# Patient Record
Sex: Male | Born: 1995 | Race: White | Hispanic: No | Marital: Single | Smoking: Never smoker
Health system: Southern US, Community
[De-identification: ages and names within clinical notes are randomized; demographics above are authoritative.]

## PROBLEM LIST (undated history)

## (undated) DIAGNOSIS — R569 Unspecified convulsions: Secondary | ICD-10-CM

## (undated) DIAGNOSIS — R55 Syncope and collapse: Secondary | ICD-10-CM

## (undated) DIAGNOSIS — K219 Gastro-esophageal reflux disease without esophagitis: Secondary | ICD-10-CM

## (undated) DIAGNOSIS — R011 Cardiac murmur, unspecified: Secondary | ICD-10-CM

## (undated) DIAGNOSIS — R112 Nausea with vomiting, unspecified: Secondary | ICD-10-CM

## (undated) DIAGNOSIS — J45990 Exercise induced bronchospasm: Secondary | ICD-10-CM

## (undated) DIAGNOSIS — J0100 Acute maxillary sinusitis, unspecified: Secondary | ICD-10-CM

## (undated) DIAGNOSIS — J309 Allergic rhinitis, unspecified: Secondary | ICD-10-CM

## (undated) DIAGNOSIS — Z9889 Other specified postprocedural states: Secondary | ICD-10-CM

## (undated) DIAGNOSIS — J45909 Unspecified asthma, uncomplicated: Secondary | ICD-10-CM

## (undated) DIAGNOSIS — Z23 Encounter for immunization: Secondary | ICD-10-CM

## (undated) HISTORY — DX: Syncope and collapse: R55

## (undated) HISTORY — DX: Allergic rhinitis, unspecified: J30.9

## (undated) HISTORY — DX: Exercise induced bronchospasm: J45.990

## (undated) HISTORY — DX: Encounter for immunization: Z23

## (undated) HISTORY — DX: Acute maxillary sinusitis, unspecified: J01.00

---

## 2014-03-03 ENCOUNTER — Encounter: Payer: Self-pay | Admitting: Neurology

## 2014-03-06 ENCOUNTER — Encounter: Payer: Self-pay | Admitting: Neurology

## 2014-03-06 ENCOUNTER — Ambulatory Visit (INDEPENDENT_AMBULATORY_CARE_PROVIDER_SITE_OTHER): Payer: Medicaid Other | Admitting: Neurology

## 2014-03-06 VITALS — BP 118/64 | HR 56 | Ht 71.5 in | Wt 175.0 lb

## 2014-03-06 DIAGNOSIS — R569 Unspecified convulsions: Secondary | ICD-10-CM

## 2014-03-06 NOTE — Progress Notes (Signed)
[facility]    Provider:  Dr Hosie Poisson Referring Provider: Tanna Furry, PA-C Primary Care Physician:  Tanna Furry, PA-C  CC:  Seizure disorder  HPI:  Anthony Yu is a 18 y.o. male here as a referral from Dr. Herbert Moors for seizure disorder.   Started 3 to 4 years ago. First episode described as talking to his mom, had acute LOC, completely unresponsive during the episode, had twitching of his extremities during the episode. Lasted a few minutes. Was very confused and disoriented after the episode. No tongue biting, no loss of bowel/bladder. He reports he could hear his mom talking during the event. Had MRI, CT head and EEG. Patient reports it was all normal. Per mom he has had 4 "large episodes" since the initial episode. Mom notes during the most recent event (a few months ago), he was talking normally, standing up, acutely became completely rigid with twitching of both all 4 extremities. Eyes were open and glassy, they helped him to the couch. It took around 30 minutes to return to normal. He will also have daily episodes where he will be sleeping and have jerking of his extremities during his sleep. Mom states she can sometimes wake him during these episodes.   Depakote was started >1 year ago. No noticeable difference since starting this medication. They do note he starts to shake when he is due his next dose. Etiology of the events are unclear. No history of seizures as a child, no febrile seizures. Mild cognitive difficulties, continues to have speech difficulties. Currently a college student, will be starting at [employer] in the spring. Otherwise healthy.  Younger sister has "essential tremor and small seizures". She is autistic. Patients sister has cerebral palsy and has seizures.   Review of Systems: Out of a complete 14 system review, the patient complains of only the following symptoms, and all other reviewed systems are negative. + headache, sleepiness seizure, passing  out, tremor, eye pain  History   Social History  . Marital Status: Single    Spouse Name: N/A    Number of Children: 0  . Years of Education: 12   Occupational History  . Not on file.   Social History Main Topics  . Smoking status: Never Smoker   . Smokeless tobacco: Never Used  . Alcohol Use: No  . Drug Use: No  . Sexual Activity: Not on file   Other Topics Concern  . Not on file   Social History Narrative   Patient is single.    Patient is a Consulting civil engineer.    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    Patient lives with family.              Family History  Problem Relation Age of Onset  . Hypertension    . Diabetes    . Cancer    . Anemia Mother   . Asthma Sister   . Asthma Mother   . Arthritis Mother   . Hypercholesterolemia Mother   . Breast cancer Maternal Grandmother   . Stroke    . Heart disease Father   . Heart disease Brother   . Heart disease Sister   . Heart attack      Past Medical History  Diagnosis Date  . Syncope and collapse   . Acute maxillary sinusitis   . Exercise induced bronchospasm   . Allergic rhinitis, cause unspecified   . Need for prophylactic vaccination and inoculation against varicella     Past Surgical History  Procedure Laterality Date  . None      Current Outpatient Prescriptions  Medication Sig Dispense Refill  . ergocalciferol (VITAMIN D2) 50000 UNITS capsule Take 50,000 Units by mouth every other day.      . Ranitidine HCl (ZANTAC PO) Take by mouth daily.      . divalproex (DEPAKOTE ER) 500 MG 24 hr tablet Take 500 mg by mouth daily.      Marland Kitchen EPINEPHrine (EPIPEN 2-PAK) 0.3 mg/0.3 mL IJ SOAJ injection Inject 0.3 mg into the muscle as directed.      . montelukast (SINGULAIR) 10 MG tablet Take 10 mg by mouth at bedtime.       No current facility-administered medications for this visit.    Allergies as of 03/06/2014 - Review Complete 03/06/2014  Allergen Reaction Noted  . Shellfish-derived products Anaphylaxis 03/06/2014  .  Benadryl [diphenhydramine hcl]  03/03/2014  . Diphenhydramine Nausea And Vomiting 03/06/2014    Vitals: BP 118/64  Pulse 56  Ht 5' 11.5" (1.816 m)  Wt 175 lb (79.379 kg)  BMI 24.07 kg/m2 Last Weight:  Wt Readings from Last 1 Encounters:  03/06/14 175 lb (79.379 kg)   Last Height:   Ht Readings from Last 1 Encounters:  03/06/14 5' 11.5" (1.816 m)     Physical exam: Exam: Gen: NAD, conversant Eyes: anicteric sclerae, moist conjunctivae HENT: Atraumatic, oropharynx clear Neck: Trachea midline; supple,  Lungs: CTA, no wheezing, rales, rhonic                          CV: RRR, no MRG Abdomen: Soft, non-tender;  Extremities: No peripheral edema  Skin: Normal temperature, no rash,  Psych: Appropriate affect, pleasant  Neuro: MS: AA&Ox3, appropriately interactive, normal affect   Speech: fluent w/o paraphasic error  Memory: good recent and remote recall  CN: PERRL, EOMI no nystagmus, no ptosis, sensation intact to LT V1-V3 bilat, face symmetric, no weakness, hearing grossly intact, palate elevates symmetrically, shoulder shrug 5/5 bilat,  tongue protrudes midline, no fasiculations noted.  Motor: normal bulk and tone Strength: 5/5  In all extremities  Coord: rapid alternating and point-to-point (FNF, HTS) movements intact.  Reflexes: symmetrical, bilat downgoing toes  Sens: LT intact in all extremities  Gait: posture, stance, stride and arm-swing normal. Tandem gait intact. Able to walk on heels and toes. Romberg absent.   Assessment:  After physical and neurologic examination, review of laboratory studies, imaging, neurophysiology testing and pre-existing records, assessment will be reviewed on the problem list.  Plan:  Treatment plan and additional workup will be reviewed under Problem List.  1)Seizures  18y/o gentleman with an outside diagnosis of seizures presenting for initial evaluation. 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 Prior to making any medication adjustments will get prior studies and refer for an ambulatory EEG. Long term if he requires an AED may consider switching to an agent with less side effects such as keppra. Follow up once ambulatory EEG is completed.    Elspeth Cho, DO  [facility] [address] [place], [place] [address]  Phone [phone] Fax [phone]

## 2014-03-06 NOTE — Patient Instructions (Signed)
Overall you are doing fairly well but I do want to suggest a few things today:   Remember to drink plenty of fluid, eat healthy meals and do not skip any meals. Try to eat protein with a every meal and eat a healthy snack such as fruit or nuts in between meals. Try to keep a regular sleep-wake schedule and try to exercise daily, particularly in the form of walking, 20-30 minutes a day, if you can.   As far as your medications are concerned, I would like to suggest you continue on the Depakote for now. Long term we may want to consider switching you to an agent with less side effects.   As far as diagnostic testing:  1) I would like you to have a long term EEG. You will be called to schedule this  Follow up once the EEG is completed. Please call us with any interim questions, concerns, problems, updates or refill requests.   My clinical assistant and will answer any of your questions and relay your messages to me and also relay most of my messages to you.   Our phone number is [phone]. We also have an after hours call service for urgent matters and there is a physician on-call for urgent questions. For any emergencies you know to call 911 or go to the nearest emergency room

## 2014-03-22 ENCOUNTER — Other Ambulatory Visit: Payer: Medicaid Other | Admitting: Radiology

## 2014-10-03 ENCOUNTER — Telehealth: Payer: Self-pay | Admitting: Neurology

## 2014-10-03 NOTE — Telephone Encounter (Signed)
Mother called to inquire about the status of the Ambulatory EEG that was ordered 03/06/14. After speaking with Carollee HerterShannon I was advised to have the patient come back in and be evaluated since he had not been seen in 7 months and the test was never done. The Mother verbalized understanding and I scheduled them to come in on 10/11/14 with Dr. Lucia GaskinsAhern.

## 2014-10-11 ENCOUNTER — Ambulatory Visit (INDEPENDENT_AMBULATORY_CARE_PROVIDER_SITE_OTHER): Payer: Medicaid Other | Admitting: Neurology

## 2014-10-11 ENCOUNTER — Encounter: Payer: Self-pay | Admitting: Neurology

## 2014-10-11 VITALS — BP 121/72 | HR 72 | Temp 98.0°F | Ht 75.0 in | Wt 170.0 lb

## 2014-10-11 DIAGNOSIS — R569 Unspecified convulsions: Secondary | ICD-10-CM

## 2014-10-11 MED ORDER — LEVETIRACETAM 500 MG PO TABS: 500.0000 mg | ORAL_TABLET | Freq: Two times a day (BID) | ORAL | Status: DC

## 2014-10-11 NOTE — Progress Notes (Addendum)
[facility]    Provider:  Dr Lucia Gaskins Referring Provider: Tanna Furry, PA-C Primary Care Physician:  Tanna Furry, PA-C  CC:  seizure  HPI:  Anthony Yu is a 19 y.o. male here as a follow up for seizure. He is a former patient of Dr. Hosie Poisson and is transitioning to my care. Seizures started 3-4 years ago, in the daytime he passed out, started jerking, wouldn't respond to 6-7 minutes(see Dr. Minus Breeding previous notes). Mother provides all the information. Last "big" seizure was 4 months ago. They were watching light shows with flashing, he started "jolting" eyes opens, no tongue biting or urination, head going "all over" in every direction, usually happens at night and he ends up on the floor. He stopped Depakote medication months ago. He has no idea how often he has them now, he could be having them every day, twitches on the couch if he falls asleep. In the last month maybe 2-3 times. He has been evaluated by multiple neurologists and sound like he has had multiple extended normal EEGs. Mother states doctors they have seen didn't think he had seizures.   Dr. Hosie Poisson 03/06/2014: Started 3 to 4 years ago. First episode described as talking to his mom, had acute LOC, completely unresponsive during the episode, had twitching of his extremities during the episode. Lasted a few minutes. Was very confused and disoriented after the episode. No tongue biting, no loss of bowel/bladder. He reports he could hear his mom talking during the event. Had MRI, CT head and EEG. Patient reports it was all normal. Per mom he has had 4 "large episodes" since the initial episode. Mom notes during the most recent event (a few months ago), he was talking normally, standing up, acutely became completely rigid with twitching of both all 4 extremities. Eyes were open and glassy, they helped him to the couch. It took around 30 minutes to return to normal. He will also have daily episodes where he will be sleeping  and have jerking of his extremities during his sleep. Mom states she can sometimes wake him during these episodes.   Depakote was started >1 year ago. No noticeable difference since starting this medication. They do note he starts to shake when he is due his next dose. Etiology of the events are unclear. No history of seizures as a child, no febrile seizures. Mild cognitive difficulties, continues to have speech difficulties. Currently a college student, will be starting at [employer] in the spring. Otherwise healthy.  Younger sister has "essential tremor and small seizures". She is autistic. Patients sister has cerebral palsy and has seizures.    Review of Systems: Patient complains of symptoms per HPI as well as the following symptoms: food allergies, hearing loss, runny nose, restless leg, insomnia, headache, seizure, tremors, pasing out, snoring, frequent awakening. Pertinent negatives per HPI. All others negative.   History   Social History  . Marital Status: Single    Spouse Name: N/A  . Number of Children: 0  . Years of Education: 12   Occupational History  . Unemployed    Social History Main Topics  . Smoking status: Never Smoker   . Smokeless tobacco: Never Used  . Alcohol Use: No  . Drug Use: No  . Sexual Activity: Not on file   Other Topics Concern  . Not on file   Social History Narrative   Patient is single.    Patient is a Consulting civil engineer.    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  Patient lives with family.    Caffeine: Drinks coffee rarely   Drinks 2-3 cups tea per day   Drinks soda rarely                    Family History  Problem Relation Age of Onset  . Hypertension    . Diabetes    . Cancer    . Anemia Mother   . Asthma Sister   . Asthma Mother   . Arthritis Mother   . Hypercholesterolemia Mother   . Breast cancer Maternal Grandmother   . Stroke    . Heart disease Father   . Heart disease Brother   . Heart disease Sister   . Heart attack      Past Medical  History  Diagnosis Date  . Syncope and collapse   . Acute maxillary sinusitis   . Exercise induced bronchospasm   . Allergic rhinitis, cause unspecified   . Need for prophylactic vaccination and inoculation against varicella     Past Surgical History  Procedure Laterality Date  . None      Current Outpatient Prescriptions  Medication Sig Dispense Refill  . EPINEPHrine (EPIPEN 2-PAK) 0.3 mg/0.3 mL IJ SOAJ injection Inject 0.3 mg into the muscle as directed.    . ergocalciferol (VITAMIN D2) 50000 UNITS capsule Take 50,000 Units by mouth every other day.    . montelukast (SINGULAIR) 10 MG tablet Take 10 mg by mouth at bedtime.    . Ranitidine HCl (ZANTAC PO) Take 150 mg by mouth daily.     Marland Kitchen. albuterol (PROVENTIL) (2.5 MG/3ML) 0.083% nebulizer solution Inhale 2.5 mg into the lungs.    . divalproex (DEPAKOTE ER) 500 MG 24 hr tablet Take 500 mg by mouth daily.     No current facility-administered medications for this visit.    Allergies as of 10/11/2014 - Review Complete 10/11/2014  Allergen Reaction Noted  . Bee venom Anaphylaxis 10/11/2014  . Shellfish-derived products Anaphylaxis 03/06/2014  . Benadryl [diphenhydramine hcl]  03/03/2014  . Diphenhydramine Nausea And Vomiting 03/06/2014    Vitals: BP 121/72 mmHg  Pulse 72  Temp(Src) 98 F (36.7 C)  Ht 6\' 3"  (1.905 m)  Wt 170 lb (77.111 kg)  BMI 21.25 kg/m2 Last Weight:  Wt Readings from Last 1 Encounters:  10/11/14 170 lb (77.111 kg)   Last Height:   Ht Readings from Last 1 Encounters:  10/11/14 6\' 3"  (1.905 m)   Physical exam: Exam: Gen: NAD, conversant, well nourised, obese, well groomed                     CV: RRR, no MRG. No Carotid Bruits. No peripheral edema, warm, nontender Eyes: Conjunctivae clear without exudates or hemorrhage  Neuro: Detailed Neurologic Exam  Speech:    Speech is normal; fluent and spontaneous with normal comprehension.  Cognition:    The patient is oriented to person, place, and  time;     recent and remote memory intact;     language fluent;     normal attention, concentration,     fund of knowledge Cranial Nerves:    The pupils are equal, round, and reactive to light. The fundi are normal and spontaneous venous pulsations are present. Visual fields are full to finger confrontation. Extraocular movements are intact. Trigeminal sensation is intact and the muscles of mastication are normal. The face is symmetric. The palate elevates in the midline. Hearing intact. Voice is normal. Shoulder shrug is normal. The  tongue has normal motion without fasciculations.   Coordination:    Normal finger to nose and heel to shin. Normal rapid alternating movements.   Gait:    Heel-toe and tandem gait are normal.   Motor Observation:    No asymmetry, no atrophy, and no involuntary movements noted. Tone:    Normal muscle tone.    Posture:    Posture is normal. normal erect    Strength:    Strength is V/V in the upper and lower limbs.     Assessment/Plan:  18y/o gentleman with an outside diagnosis of seizures presenting for follow up. 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 Need records.   [facility] in [place] need records  Had 2 day EEG at [facility] Dr. Luellen Pucker  Will start Keppra  twice dily No driving until 6 months seizure free, no bathing or swimming alone or anything that could hurt patient or others should he have a seizure  Received records from [facility]. Patient presents to the EMU with his mother for further evaluation and characterization of episodes of jerking started around the age of 34 and have not responded to antiepileptic drugs. EMU admission December 2015. The events occurred out of sleep and he tended to jerk and fall out of bed. Under a lot of stress.  Last seizure 2 weeks prior to admission. Patient also with episodes of stiffness and crying. No history of head trauma, febrile convulsions or CNS infections. 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 Patient was admitted to the EMU for further characterization of episodes of jerking in the night, study December 2015. He came in without AEDs as he felt that these were not helpful. The jerking started around the age of 4 and has not responded to antiepileptic drugs. He jerks and falls out of bed. Sporadic. In the setting of a lot of stress. Patient also reported episodes of stiffness and crying. Although his episodes had been nightly over the last few months, in the 2 weeks prior to admission he had not had any events. The patient had no events during this admission and felt he would not have any because he was too relaxed.   Notes from [facility] in [place]: Sleep study was completed in May 2015 and was essentially normal without any periodic limb movements. He had been on Depakote. The episodes increased despite increasing Depakote 1000 mg daily at bedtime. Routine EEG was negative. MRI of the brain dated April 2013 was normal, with and without contrast performed with seizure protocol.  Naomie Dean, MD  [facility] [address] [place], [place] [address]  Phone [phone] Fax [phone]  A total of 30 minutes was spent face-to-face with this patient. Over half this time was spent on counseling patient on the seizure diagnosis and different diagnostic and therapeutic options available.

## 2014-10-11 NOTE — Patient Instructions (Signed)
Overall you are doing fairly well but I do want to suggest a few things today:   Remember to drink plenty of fluid, eat healthy meals and do not skip any meals. Try to eat protein with a every meal and eat a healthy snack such as fruit or nuts in between meals. Try to keep a regular sleep-wake schedule and try to exercise daily, particularly in the form of walking, 20-30 minutes a day, if you can.   As far as your medications are concerned, I would like to suggest: Keppra 500mg  twice daily  I would like to see you back in 3 months, sooner if we need to. Please call us with any interim questions, concerns, problems, updates or refill requests.   Please also call us for any test results so we can go over those with you on the phone.  My clinical assistant and will answer any of your questions and relay your messages to me and also relay most of my messages to you.   Our phone number is [phone]. We also have an after hours call service for urgent matters and there is a physician on-call for urgent questions. For any emergencies you know to call 911 or go to the nearest emergency room

## 2014-10-12 ENCOUNTER — Telehealth: Payer: Self-pay | Admitting: *Deleted

## 2014-10-12 NOTE — Telephone Encounter (Signed)
[facility]

## 2014-10-12 NOTE — Telephone Encounter (Signed)
Request fax to Dr. [facility] and Dr. Luellen [facility]ucker[facility] office on 10/12/14 requesting records.

## 2014-10-13 ENCOUNTER — Telehealth: Payer: Self-pay | Admitting: *Deleted

## 2014-10-13 NOTE — Telephone Encounter (Signed)
Dr. [facility] office were unable to fax over records on patient, the records will be mail to our office.

## 2014-10-16 ENCOUNTER — Telehealth: Payer: Self-pay | Admitting: *Deleted

## 2014-10-16 NOTE — Telephone Encounter (Signed)
Receive records from Dr. [facility] on 10/16/14. The records on [place] desk.

## 2014-12-06 HISTORY — PX: NASAL SEPTUM SURGERY: SHX37

## 2015-01-10 ENCOUNTER — Ambulatory Visit (INDEPENDENT_AMBULATORY_CARE_PROVIDER_SITE_OTHER): Payer: Medicaid Other | Admitting: Neurology

## 2015-01-10 ENCOUNTER — Encounter: Payer: Self-pay | Admitting: Neurology

## 2015-01-10 VITALS — BP 129/74 | HR 73 | Ht 75.0 in | Wt 171.2 lb

## 2015-01-10 DIAGNOSIS — R569 Unspecified convulsions: Secondary | ICD-10-CM | POA: Diagnosis not present

## 2015-01-10 DIAGNOSIS — F445 Conversion disorder with seizures or convulsions: Secondary | ICD-10-CM | POA: Insufficient documentation

## 2015-01-10 MED ORDER — LEVETIRACETAM ER 500 MG PO TB24: 500.0000 mg | ORAL_TABLET | Freq: Every day | ORAL | Status: DC

## 2015-01-10 NOTE — Progress Notes (Signed)
[facility]    Provider:  Dr Lucia Gaskins Referring Provider: Tanna Furry, PA-C Primary Care Physician:  Tanna Furry, PA-C  CC:  Seizure  HPI:  Anthony Yu is a 19 y.o. male here as a referral from Dr. Herbert Moors for seizure-like events. 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 The other night, he felt his whole body shaking. He couldn't move or talk. He was in bed trying to go to sleep. Episodes always happen when sleeping. He is doing a sleep test at the end of this month in [place]. He has not had any episodes since being seen except for one stated above. Keppra may be helping but is making him stiff.   HPI: Anthony Yu is a 19 y.o. male here as a follow up for seizure. He is a former patient of Dr. Hosie Poisson and is transitioning to my care. Seizures started 3-4 years ago, in the daytime he passed out, started jerking, wouldn't respond to 6-7 minutes(see Dr. Minus Breeding previous notes). Mother provides all the information. Last "big" seizure was 4 months ago. They were watching light shows with flashing, he started "jolting" eyes opens, no tongue biting or urination, head going "all over" in every direction, usually happens at night and he ends up on the floor. He stopped Depakote medication months ago. He has no idea how often he has them now, he could be having them every day, twitches on the couch if he falls asleep. In the last month maybe 2-3 times. He has been evaluated by multiple neurologists and sound like he has had multiple extended normal EEGs. Mother states doctors they have seen didn't think he had seizures.   Dr. Hosie Poisson 03/06/2014: Started 3 to 4 years ago. First episode described as talking to his mom, had acute LOC, completely unresponsive during the episode, had twitching of his extremities during the episode. Lasted a few minutes. Was very confused and disoriented after the episode. No tongue biting, no loss of bowel/bladder. He reports he could hear his mom talking during the  event. Had MRI, CT head and EEG. Patient reports it was all normal. Per mom he has had 4 "large episodes" since the initial episode. Mom notes during the most recent event (a few months ago), he was talking normally, standing up, acutely became completely rigid with twitching of both all 4 extremities. Eyes were open and glassy, they helped him to the couch. It took around 30 minutes to return to normal. He will also have daily episodes where he will be sleeping and have jerking of his extremities during his sleep. Mom states she can sometimes wake him during these episodes.   Depakote was started >1 year ago. No noticeable difference since starting this medication. They do note he starts to shake when he is due his next dose. Etiology of the events are unclear. No history of seizures as a child, no febrile seizures. Mild cognitive difficulties, continues to have speech difficulties. Currently a college student, will be starting at [employer] in the spring. Otherwise healthy.  Younger sister has "essential tremor and small seizures". She is autistic. Patients sister has cerebral palsy and has seizures.   Review of Systems: Patient complains of symptoms per HPI as well as the following symptoms: Fatigue, eye discharge, eye itching, ringing in the ears, murmur, food allergies, restless legs, daytime sleepiness, snoring, joint pain, back pain, aching muscles, neck stiffness, dizziness, headache, seizure, tremors, agitation. Pertinent negatives per HPI. All others negative.   History   Social History  . Marital Status:  Single    Spouse Name: N/A  . Number of Children: 0  . Years of Education: 12   Occupational History  . Unemployed    Social History Main Topics  . Smoking status: Never Smoker   . Smokeless tobacco: Never Used  . Alcohol Use: No  . Drug Use: No  . Sexual Activity: Not on file   Other Topics Concern  . Not on file   Social History Narrative   Patient is a Consulting civil engineerstudent.    Patient  lives with family.    Caffeine: Drinks coffee rarely   Drinks 2-3 cups tea per day   Drinks soda rarely                    Family History  Problem Relation Age of Onset  . Hypertension    . Diabetes    . Cancer    . Anemia Mother   . Asthma Sister   . Asthma Mother   . Arthritis Mother   . Hypercholesterolemia Mother   . Breast cancer Maternal Grandmother   . Stroke    . Heart disease Father   . Heart disease Brother   . Heart disease Sister   . Heart attack      Past Medical History  Diagnosis Date  . Syncope and collapse   . Acute maxillary sinusitis   . Exercise induced bronchospasm   . Allergic rhinitis, cause unspecified   . Need for prophylactic vaccination and inoculation against varicella     Past Surgical History  Procedure Laterality Date  . Nasal septum surgery  June 2016    Deviated Septum    Current Outpatient Prescriptions  Medication Sig Dispense Refill  . albuterol (PROVENTIL HFA;VENTOLIN HFA) 108 (90 BASE) MCG/ACT inhaler Inhale 2 puffs into the lungs every 6 (six) hours as needed for wheezing or shortness of breath.    . EPINEPHrine (EPIPEN 2-PAK) 0.3 mg/0.3 mL IJ SOAJ injection Inject 0.3 mg into the muscle as directed.    . ergocalciferol (VITAMIN D2) 50000 UNITS capsule Take 50,000 Units by mouth every other day.    . levETIRAcetam (KEPPRA) 500 MG tablet Take 1 tablet (500 mg total) by mouth 2 (two) times daily. 60 tablet 6  . montelukast (SINGULAIR) 10 MG tablet Take 10 mg by mouth at bedtime.    Marland Kitchen. omeprazole (PRILOSEC) 20 MG capsule Take 20 mg by mouth daily.    . divalproex (DEPAKOTE ER) 500 MG 24 hr tablet Take 500 mg by mouth daily.     No current facility-administered medications for this visit.    Allergies as of 01/10/2015 - Review Complete 01/10/2015  Allergen Reaction Noted  . Bee venom Anaphylaxis 10/11/2014  . Shellfish-derived products Anaphylaxis 03/06/2014  . Benadryl [diphenhydramine hcl]  03/03/2014  . Diphenhydramine  Nausea And Vomiting 03/06/2014    Vitals: BP 129/74 mmHg  Pulse 73  Ht 6\' 3"  (1.905 m)  Wt 171 lb 3.2 oz (77.656 kg)  BMI 21.40 kg/m2 Last Weight:  Wt Readings from Last 1 Encounters:  01/10/15 171 lb 3.2 oz (77.656 kg)   Last Height:   Ht Readings from Last 1 Encounters:  01/10/15 6\' 3"  (1.905 m)   Physical exam: Exam: Gen: NAD, conversant, well nourised, well groomed                     CV: RRR, no MRG. No Carotid Bruits. No peripheral edema, warm, nontender Eyes: Conjunctivae clear without exudates or hemorrhage  Neuro: Detailed Neurologic Exam  Speech:    Speech is normal; fluent and spontaneous with normal comprehension.  Cognition:    The patient is oriented to person, place, and time;     recent and remote memory intact;     language fluent;     normal attention, concentration,     fund of knowledge Cranial Nerves:    The pupils are equal, round, and reactive to light. The fundi are normal and spontaneous venous pulsations are present. Visual fields are full to finger confrontation. Extraocular movements are intact. Trigeminal sensation is intact and the muscles of mastication are normal. The face is symmetric. The palate elevates in the midline. Hearing intact. Voice is normal. Shoulder shrug is normal. The tongue has normal motion without fasciculations.   Coordination:    Normal finger to nose and heel to shin. Normal rapid alternating movements.     Assessment/Plan:  18y/o gentleman with an outside diagnosis of seizures presenting for follow up. 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   Review of records: He was admitted to the [facility] and diagnosed with non-epileptic seizures. Sleep study was completed in May 2015 and was essentially normal without any periodic  limb movements. He had been on Depakote. He was diagnosed with complex partial seizure.Sleep study was completed in May 2015 and was essentially normal without any periodic limb movements. Routine EEG was negative. MRI of the brain dated April 2013 was normal, with and without contrast performed with seizure protocol.   No driving until 6 months seizure free, no bathing or swimming alone or anything that could hurt patient or others should he have a seizure  Addendum [facility]: Impression this patient's history suggests nonepileptic events. Patient was admitted to the EMU for further characterization of episodes of jerking in the night, study December 2015. He came in without AEDs as he felt that these were not helpful. The jerking started around the age of 89 and has not responded to antiepileptic drugs. He jerks and falls out of bed. Sporadic. In the setting of a lot of stress. Patient also reported episodes of stiffness and crying. Although his episodes had been nightly over the last few months, in the 2 weeks prior to admission he had not had any events. The patient had no events during this admission and felt he would not have any because he was too relaxed.    MRi of the brain w/wo contrast EEG Will order long-term monitoring if EEG negative Per mother, he had episodes with long-term monitoring. No seizures were seen on EEG. He does feel the Keppra is helping. Will continue on a low dose of XR in the evenings but if workup is negative will discontinue.      Naomie Dean, MD  [facility] [address] [place], [place] [address]  Phone [phone] Fax [phone]  A total of 30 minutes was spent in with this patient face to face. Over half this time was spent on counseling patient on the diagnosis and different therapeutic options available for evaluation of seizures vs non-epileptic events.

## 2015-01-10 NOTE — Patient Instructions (Signed)
Overall you are doing fairly well but I do want to suggest a few things today:   Remember to drink plenty of fluid, eat healthy meals and do not skip any meals. Try to eat protein with a every meal and eat a healthy snack such as fruit or nuts in between meals. Try to keep a regular sleep-wake schedule and try to exercise daily, particularly in the form of walking, 20-30 minutes a day, if you can.   As far as your medications are concerned, I would like to suggest: change to keppra 500mg  at night extended release  As far as diagnostic testing: MRi of the brain and eeg. If negative will order long-term eeg.  I would like to see you back in 4 months, sooner if we need to. Please call us with any interim questions, concerns, problems, updates or refill requests.   Please also call us for any test results so we can go over those with you on the phone.  My clinical assistant and will answer any of your questions and relay your messages to me and also relay most of my messages to you.   Our phone number is [phone]. We also have an after hours call service for urgent matters and there is a physician on-call for urgent questions. For any emergencies you know to call 911 or go to the nearest emergency room

## 2015-01-17 ENCOUNTER — Telehealth: Payer: Self-pay | Admitting: *Deleted

## 2015-01-17 ENCOUNTER — Ambulatory Visit (INDEPENDENT_AMBULATORY_CARE_PROVIDER_SITE_OTHER): Payer: Medicaid Other | Admitting: Neurology

## 2015-01-17 DIAGNOSIS — R569 Unspecified convulsions: Secondary | ICD-10-CM

## 2015-01-17 NOTE — Procedures (Signed)
    History:  Anthony Yu is a 19 year old patient with episodes of total body shaking, unable to move or talk. Episodes occur out of sleep. The patient is being evaluated for possible seizures.  This is a routine EEG. No skull defects are noted. Medications include albuterol, vitamin D supplementation, Keppra, Singulair, and Prilosec.   EEG classification: Normal awake and asleep  Description of the recording: The background rhythms of this recording consists of a fairly well modulated medium amplitude background activity of 9 Hz. As the record progresses, the patient initially is in the waking state, but appears to enter the early stage II sleep during the recording, with rudimentary sleep spindles and vertex sharp wave activity seen. During the wakeful state, photic stimulation is performed, and this results in a bilateral and symmetric photic driving response. Hyperventilation was also performed, and this results in a minimal buildup of the background rhythm activities without significant slowing seen. At no time during the recording does there appear to be evidence of spike or spike wave discharges or evidence of focal slowing. EKG monitor shows no evidence of cardiac rhythm abnormalities with a heart rate of 78.  Impression: This is a normal EEG recording in the waking and sleeping state. No evidence of ictal or interictal discharges were seen at any time during the recording.

## 2015-01-17 NOTE — Telephone Encounter (Signed)
Thanks Kara MeadEmma for reminding me! When you get back, would you be able to help me please place a referral for 72-hour home EEG monitoring for this patient please?

## 2015-01-17 NOTE — Telephone Encounter (Signed)
Left detailed VM to let pt know EEG was normal. Told him I will let Dr. Lucia GaskinsAhern know and she will possibly order long-term EEG since it was normal. Told him to call back with further questions. Gave [facility] phone # and office hours. Dr. Lucia GaskinsAhern and I are not in office on Friday's.

## 2015-01-24 ENCOUNTER — Other Ambulatory Visit: Payer: Self-pay | Admitting: *Deleted

## 2015-01-24 DIAGNOSIS — F445 Conversion disorder with seizures or convulsions: Secondary | ICD-10-CM

## 2015-01-24 NOTE — Telephone Encounter (Signed)
Spoke w/ mother to let her know [facility] 72-hour EEG in home study. Told her to call w/ further questions. Mother verbalized understanding.

## 2015-01-25 ENCOUNTER — Encounter (INDEPENDENT_AMBULATORY_CARE_PROVIDER_SITE_OTHER): Payer: Medicaid Other | Admitting: Neurology

## 2015-01-25 ENCOUNTER — Ambulatory Visit
Admission: RE | Admit: 2015-01-25 | Discharge: 2015-01-25 | Disposition: A | Discharge status: Home / Self Care | Payer: Medicaid Other | Source: Ambulatory Visit | Attending: Neurology | Admitting: Neurology

## 2015-01-25 ENCOUNTER — Other Ambulatory Visit: Payer: Self-pay | Admitting: Neurology

## 2015-01-25 DIAGNOSIS — R569 Unspecified convulsions: Secondary | ICD-10-CM

## 2015-01-31 ENCOUNTER — Telehealth: Payer: Self-pay | Admitting: *Deleted

## 2015-01-31 NOTE — Telephone Encounter (Signed)
Spoke w/ mother about unremarkable MRI brain. No further questions at this time. Told her to call back if they have further questions.

## 2015-04-04 ENCOUNTER — Telehealth: Payer: Self-pay | Admitting: Neurology

## 2015-04-04 NOTE — Telephone Encounter (Signed)
I called patient and his mother, let a message. Asked why they no-showed to [facility]. Asked them to call me back.

## 2015-04-04 NOTE — Telephone Encounter (Signed)
[facility] [phone] called to advise, Dr Lucia Gaskins referred patient there for 72hr ambulatory EEG. Patient was no show for appointment today.

## 2015-05-14 ENCOUNTER — Telehealth: Payer: Self-pay | Admitting: *Deleted

## 2015-05-14 ENCOUNTER — Ambulatory Visit: Payer: Medicaid Other | Admitting: Neurology

## 2015-05-14 NOTE — Telephone Encounter (Signed)
no showed follow up appointment  

## 2015-05-15 ENCOUNTER — Encounter: Payer: Self-pay | Admitting: Neurology

## 2019-10-07 ENCOUNTER — Encounter (HOSPITAL_COMMUNITY): Payer: Self-pay | Admitting: Emergency Medicine

## 2019-10-07 ENCOUNTER — Emergency Department (HOSPITAL_COMMUNITY): Payer: Self-pay

## 2019-10-07 ENCOUNTER — Emergency Department (HOSPITAL_COMMUNITY)
Admission: EM | Admit: 2019-10-07 | Discharge: 2019-10-07 | Disposition: A | Discharge status: Home / Self Care | Payer: Self-pay | Attending: Emergency Medicine | Admitting: Emergency Medicine

## 2019-10-07 ENCOUNTER — Other Ambulatory Visit: Payer: Self-pay

## 2019-10-07 DIAGNOSIS — R1033 Periumbilical pain: Secondary | ICD-10-CM

## 2019-10-07 DIAGNOSIS — J45909 Unspecified asthma, uncomplicated: Secondary | ICD-10-CM | POA: Insufficient documentation

## 2019-10-07 DIAGNOSIS — K429 Umbilical hernia without obstruction or gangrene: Secondary | ICD-10-CM | POA: Insufficient documentation

## 2019-10-07 DIAGNOSIS — K4091 Unilateral inguinal hernia, without obstruction or gangrene, recurrent: Secondary | ICD-10-CM | POA: Insufficient documentation

## 2019-10-07 DIAGNOSIS — Z79899 Other long term (current) drug therapy: Secondary | ICD-10-CM | POA: Insufficient documentation

## 2019-10-07 HISTORY — DX: Unspecified convulsions: R56.9

## 2019-10-07 HISTORY — DX: Unspecified asthma, uncomplicated: J45.909

## 2019-10-07 LAB — COMPREHENSIVE METABOLIC PANEL
ALT: 39 U/L (ref 0–44)
AST: 26 U/L (ref 15–41)
Albumin: 4.8 g/dL (ref 3.5–5.0)
Alkaline Phosphatase: 55 U/L (ref 38–126)
Anion gap: 11 (ref 5–15)
BUN: 11 mg/dL (ref 6–20)
CO2: 27 mmol/L (ref 22–32)
Calcium: 9.5 mg/dL (ref 8.9–10.3)
Chloride: 102 mmol/L (ref 98–111)
Creatinine, Ser: 1.23 mg/dL (ref 0.61–1.24)
GFR calc Af Amer: >60 mL/min (ref >60)
GFR calc non Af Amer: >60 mL/min (ref >60)
Glucose, Bld: 105 mg/dL — ABNORMAL HIGH (ref 70–99)
Potassium: 4.1 mmol/L (ref 3.5–5.1)
Sodium: 140 mmol/L (ref 135–145)
Total Bilirubin: 1.2 mg/dL (ref 0.3–1.2)
Total Protein: 7.6 g/dL (ref 6.5–8.1)

## 2019-10-07 LAB — CBC
HCT: 49.4 % (ref 39.0–52.0)
Hemoglobin: 15.9 g/dL (ref 13.0–17.0)
MCH: 26.7 pg (ref 26.0–34.0)
MCHC: 32.2 g/dL (ref 30.0–36.0)
MCV: 82.9 fL (ref 80.0–100.0)
Platelets: 276 10*3/uL (ref 150–400)
RBC: 5.96 MIL/uL — ABNORMAL HIGH (ref 4.22–5.81)
RDW: 12.2 % (ref 11.5–15.5)
WBC: 9.5 10*3/uL (ref 4.0–10.5)
nRBC: 0 % (ref 0.0–0.2)

## 2019-10-07 LAB — LIPASE, BLOOD: Lipase: 27 U/L (ref 11–51)

## 2019-10-07 MED ORDER — MORPHINE SULFATE (PF) 4 MG/ML IV SOLN
4.0000 mg | Freq: Once | INTRAVENOUS | Status: AC
  Administered 2019-10-07: 4 mg via INTRAVENOUS
  Filled 2019-10-07: qty 1

## 2019-10-07 MED ORDER — SODIUM CHLORIDE 0.9 % IV BOLUS
1000.0000 mL | Freq: Once | INTRAVENOUS | Status: AC
  Administered 2019-10-07: 15:00:00 1000 mL via INTRAVENOUS

## 2019-10-07 MED ORDER — IOHEXOL 300 MG/ML  SOLN
100.0000 mL | Freq: Once | INTRAMUSCULAR | Status: AC | PRN
  Administered 2019-10-07: 100 mL via INTRAVENOUS

## 2019-10-07 MED ORDER — ONDANSETRON HCL 4 MG/2ML IJ SOLN
4.0000 mg | Freq: Once | INTRAMUSCULAR | Status: AC
  Administered 2019-10-07: 15:00:00 4 mg via INTRAVENOUS
  Filled 2019-10-07: qty 2

## 2019-10-07 MED ORDER — SODIUM CHLORIDE 0.9% FLUSH: 3.0000 mL | Freq: Once | INTRAVENOUS | Status: DC

## 2019-10-07 NOTE — ED Triage Notes (Signed)
abd pain since this am states has a hernia umbilical  And  It started to hurt this am , no n/v but somne sweating  As pain increased , last BM this am  Denies dysuria

## 2019-10-07 NOTE — Discharge Instructions (Signed)
Have a hernia in your scrotum as well as your periumbilical region.  If you develop severe pain to either 1 of these areas you need to be seen here in the emergency department.  These areas do not look like they are incarcerated at this time which means I do not see decreased blood flow.  I would recommend following up with general surgery to have possible surgical intervention of these.

## 2019-10-07 NOTE — ED Provider Notes (Addendum)
[facility] EMERGENCY DEPARTMENT Provider Note   CSN: [id number] Arrival date & time: 10/07/19  1313     History Chief Complaint  Patient presents with   Abdominal Pain    Anthony Yu is a 24 y.o. male with past medical history significant for pseudoseizures, seizures, asthma, recurrent syncope who presents for evaluation abdominal pain.  Patient with longstanding umbilical hernia.  Woke up this morning with pain located to this area.  No emesis, passing flatulence and had a bowel movement this morning which is normal.  Denies fever, chills, nausea, vomiting, chest pain, shortness of breath, testicular pain, redness, swelling, dysuria, diarrhea or constipation.  Denies aggravating or alleviating factors.  Rates his pain a 5/10.  History obtained from patient and past medical record.  No interpreter is used.  HPI     Past Medical History:  Diagnosis Date   Acute maxillary sinusitis    Allergic rhinitis, cause unspecified    Asthma    Exercise induced bronchospasm    Need for prophylactic vaccination and inoculation against varicella    Seizures (HCC)    Syncope and collapse     Patient Active Problem List   Diagnosis Date Noted   Pseudoseizures 01/10/2015   Seizures (HCC) 03/06/2014    Past Surgical History:  Procedure Laterality Date   NASAL SEPTUM SURGERY  June 2016   Deviated Septum       Family History  Problem Relation Age of Onset   Anemia Mother    Asthma Mother    Arthritis Mother    Hypercholesterolemia Mother    Heart disease Father    Hypertension Other    Diabetes Other    Cancer Other    Asthma Sister    Breast cancer Maternal Grandmother    Stroke Other    Heart disease Brother    Heart disease Sister    Heart attack Other     Social History   Tobacco Use   Smoking status: Never Smoker   Smokeless tobacco: Never Used  Substance Use Topics   Alcohol use: No   Drug use: No    Home  Medications Prior to Admission medications   Medication Sig Start Date End Date Taking? Authorizing Provider  albuterol (PROVENTIL HFA;VENTOLIN HFA) 108 (90 BASE) MCG/ACT inhaler Inhale 2 puffs into the lungs every 6 (six) hours as needed for wheezing or shortness of breath.   Yes [provider]  EPINEPHrine (EPIPEN 2-PAK) 0.3 mg/0.3 mL IJ SOAJ injection Inject 0.3 mg into the muscle as directed.   Yes [provider]  omeprazole (PRILOSEC) 20 MG capsule Take 20 mg by mouth daily as needed (heartburn).    Yes [provider]    Allergies    Bee venom, Shellfish-derived products, Benadryl [diphenhydramine hcl], and Diphenhydramine  Review of Systems   Review of Systems  Constitutional: Negative.   HENT: Negative.   Respiratory: Negative.   Cardiovascular: Negative.   Gastrointestinal: Positive for abdominal pain. Negative for abdominal distention, anal bleeding, blood in stool, constipation, diarrhea, nausea, rectal pain and vomiting.  Genitourinary: Negative.   Musculoskeletal: Negative.   Skin: Negative.   Neurological: Negative.   All other systems reviewed and are negative.   Physical Exam Updated Vital Signs BP (!) 105/53 (BP Location: Left Arm)    Pulse 72    Temp (!) 97.5 F (36.4 C) (Oral)    Resp 17    Ht 6\' 3"  (1.905 m)    Wt 98.9 kg  SpO2 99%    BMI 27.25 kg/m   Physical Exam Vitals and nursing note reviewed.  Constitutional:      General: He is not in acute distress.    Appearance: He is well-developed. He is not ill-appearing, toxic-appearing or diaphoretic.  HENT:     Head: Normocephalic and atraumatic.     Mouth/Throat:     Mouth: Mucous membranes are moist.     Pharynx: Oropharynx is clear.  Eyes:     Pupils: Pupils are equal, round, and reactive to light.  Cardiovascular:     Rate and Rhythm: Normal rate and regular rhythm.     Heart sounds: Normal heart sounds.  Pulmonary:     Effort: Pulmonary effort is normal. No  respiratory distress.     Breath sounds: Normal breath sounds.  Abdominal:     General: Bowel sounds are normal. There is no distension.     Palpations: Abdomen is soft.     Tenderness: There is abdominal tenderness in the periumbilical area.     Hernia: A hernia is present. Hernia is present in the umbilical area.     Comments: Periumbilical hernia which is hardened.  No overlying skin changes.  Tender to palpation.  Was able to reduce this on initial exam.  Genitourinary:    Comments: Refused GU exam Musculoskeletal:        General: Normal range of motion.     Cervical back: Normal range of motion and neck supple.  Skin:    General: Skin is warm and dry.     Capillary Refill: Capillary refill takes less than 2 seconds.     Comments: No edema, erythema or warmth.  Neurological:     Mental Status: He is alert.     ED Results / Procedures / Treatments   Labs (all labs ordered are listed, but only abnormal results are displayed) Labs Reviewed  COMPREHENSIVE METABOLIC PANEL - Abnormal; Notable for the following components:      Result Value   Glucose, Bld 105 (*)    All other components within normal limits  CBC - Abnormal; Notable for the following components:   RBC 5.96 (*)    All other components within normal limits  LIPASE, BLOOD  URINALYSIS, ROUTINE W REFLEX MICROSCOPIC    EKG None  Radiology CT Abdomen Pelvis W Contrast  Result Date: 10/07/2019 CLINICAL DATA:  Abdominal pain, inguinal pain, suspected hernia EXAM: CT ABDOMEN AND PELVIS WITH CONTRAST TECHNIQUE: Multidetector CT imaging of the abdomen and pelvis was performed using the standard protocol following bolus administration of intravenous contrast. CONTRAST:  OMNIPAQUE IOHEXOL 300 MG/ML  SOLN COMPARISON:  None. FINDINGS: Lower chest: Hypoventilatory changes are seen the lung bases. No acute pleural or parenchymal lung disease. Hepatobiliary: No focal liver abnormality is seen. No gallstones, gallbladder wall  thickening, or biliary dilatation. Pancreas: Unremarkable. No pancreatic ductal dilatation or surrounding inflammatory changes. Spleen: Normal in size without focal abnormality. Adrenals/Urinary Tract: Adrenal glands are unremarkable. Kidneys are normal, without renal calculi, focal lesion, or hydronephrosis. Bladder is unremarkable. Stomach/Bowel: Normal appendix right lower quadrant. No bowel obstruction or ileus. No bowel wall thickening or inflammatory changes. Vascular/Lymphatic: No significant vascular findings are present. No enlarged abdominal or pelvic lymph nodes. Reproductive: Prostate is unremarkable. Other: There is a large right inguinal hernia, extending into the right hemiscrotum. Hernia sac measures 5.4 cm at the level of the inguinal canal. The hernia contains multiple loops of distal small bowel, though I do not see any  bowel wall thickening. Minimal fat stranding within the herniated fat. No free fluid. There is a fat containing supraumbilical hernia. Musculoskeletal: No acute or destructive bony lesions. Reconstructed images demonstrate no additional findings. IMPRESSION: 1. Large right inguinal hernia, extending into the right hemiscrotum, containing mesenteric fat and multiple loops of distal small bowel. No evidence of bowel wall edema or incarceration. 2. Fat containing umbilical hernia. Electronically Signed   By: Sharlet Salina M.D.   On: 10/07/2019 17:24    Procedures Hernia reduction  Date/Time: 10/07/2019 2:33 PM Performed by: Linwood Dibbles, PA-C Authorized by: Linwood Dibbles, PA-C  Consent: Verbal consent obtained. Written consent not obtained. Risks and benefits: risks, benefits and alternatives were discussed Consent given by: patient Patient understanding: patient states understanding of the procedure being performed Patient consent: the patient's understanding of the procedure matches consent given Procedure consent: procedure consent matches procedure  scheduled Relevant documents: relevant documents present and verified Test results: test results available and properly labeled Site marked: the operative site was marked Imaging studies: imaging studies available Required items: required blood products, implants, devices, and special equipment available Time out: Immediately prior to procedure a "time out" was called to verify the correct patient, procedure, equipment, support staff and site/side marked as required. Preparation: Patient was prepped and draped in the usual sterile fashion. Local anesthesia used: no  Anesthesia: Local anesthesia used: no  Sedation: Patient sedated: no  Patient tolerance: patient tolerated the procedure well with no immediate complications    (including critical care time)  Medications Ordered in ED Medications  sodium chloride flush (NS) 0.9 % injection 3 mL (3 mLs Intravenous Not Given 10/07/19 1703)  sodium chloride 0.9 % bolus 1,000 mL (0 mLs Intravenous Stopped 10/07/19 1600)  morphine 4 MG/ML injection 4 mg (4 mg Intravenous Given 10/07/19 1455)  ondansetron (ZOFRAN) injection 4 mg (4 mg Intravenous Given 10/07/19 1455)  iohexol (OMNIPAQUE) 300 MG/ML solution 100 mL (100 mLs Intravenous Contrast Given 10/07/19 1717)    ED Course  I have reviewed the triage vital signs and the nursing notes.  Pertinent labs & imaging results that were available during my care of the patient were reviewed by me and considered in my medical decision making (see chart for details).  24 year old male presents for evaluation of abdominal pain.  Longstanding history of periumbilical hernia.  He is afebrile, nonseptic, non-ill-appearing.  Heart and periumbilical hernia. No overlying skin changes.  I was able to reduce this on initial exam.  Labs were obtained from triage.  Will provide pain management  Labs and imaging personally reviewed and interpreted CBC without leukocytosis Metabolic panel with mild hyperglycemia to  105 CT AP with periumbilical hernia chaining fat.  Patient also has a large right inguinal hernia containing loops of bowel.  He is passing flatulence and having normal bowel movements.  Patient states he has had this previously.  He has no pain he is urinating without difficulty.  Evidence of incarceration or strangulation on CT scan.  Patient reassessed.  No return of periumbilical hernia.  Discussed CT findings of inguinal hernia.  He denies any testicular pain, swelling, redness, pain with bowel movements.  He is refusing GU exam. He is tolerating p.o. intake without difficulty.  We will have him follow-up with general surgery for his hernias.  No evidence of incarceration or strangulation on imaging at this time.  Patient is nontoxic, nonseptic appearing, in no apparent distress.  Patient's pain and other symptoms adequately managed in emergency department.  Fluid bolus given.  Labs, imaging and vitals reviewed.  Patient does not meet the SIRS or Sepsis criteria.  On repeat exam patient does not have a surgical abdomin and there are no peritoneal signs.  No indication of appendicitis, bowel obstruction, bowel perforation, cholecystitis, diverticulitis.  Patient discharged home with symptomatic treatment and given strict instructions for follow-up with their primary care physician.  I have also discussed reasons to return immediately to the ER.  Patient expresses understanding and agrees with plan.     MDM Rules/Calculators/A&P                       Final Clinical Impression(s) / ED Diagnoses Final diagnoses:  Periumbilical abdominal pain  Unilateral recurrent inguinal hernia without obstruction or gangrene  Umbilical hernia without obstruction and without gangrene    Rx / DC Orders ED Discharge Orders    None       Smith Mcnicholas A, PA-C 10/07/19 1802    Aiyah Scarpelli A, PA-C 10/07/19 1803    Charlesetta Shanks, MD 10/09/19 [phone]

## 2019-10-07 NOTE — ED Notes (Signed)
Taken to CT.

## 2019-12-19 ENCOUNTER — Ambulatory Visit: Payer: Self-pay | Admitting: General Surgery

## 2019-12-30 NOTE — Patient Instructions (Addendum)
DUE TO COVID-19 ONLY ONE VISITOR ARE ALLOWED TO COME WITH YOU AND STAY IN THE WAITING ROOM ONLY DURING PRE OP AND PROCEDURE. THEN TWO VISITORS MAY VISIT WITH YOU IN YOUR PRIVATE ROOM DURING VISITING HOURS ONLY!!   COVID SWAB TESTING MUST BE COMPLETED ON: Tuesday, January 10, 2020  At 8:30 AM [address], [place] [facility] enter pre surgical testing line (Must self quarantine after testing. Follow instructions on handout.)             Your procedure is scheduled on: Thursday, January 12, 2020   Report to [facility] Main  Entrance   Report to Short Stay at 5:30 AM   [facility])   Call this number if you have problems the morning of surgery [phone]   Do not eat food:After Midnight.   May have liquids until 4:30 AM day of surgery   CLEAR LIQUID DIET  Foods Allowed                                                                     Foods Excluded  Water, Black Coffee and tea, regular and decaf                             liquids that you cannot  Plain Jell-O in any flavor  (No red)                                           see through such as: Fruit ices (not with fruit pulp)                                     milk, soups, orange juice  Iced Popsicles (No red)                                    All solid food                                   Apple juices Sports drinks like Gatorade (No red) Lightly seasoned clear broth or consume(fat free) Sugar, honey syrup  Sample Menu Breakfast                                Lunch                                     Supper Cranberry juice                    Beef broth                            Chicken broth Jell-O  Grape juice                           Apple juice Coffee or tea                        Jell-O                                      Popsicle                                                Coffee or tea                        Coffee or tea   Oral Hygiene is  also important to reduce your risk of infection.                                    Remember - BRUSH YOUR TEETH THE MORNING OF SURGERY WITH YOUR REGULAR TOOTHPASTE   Do NOT smoke after Midnight   Take these medicines the morning of surgery with A SIP OF WATER: None   Bring Asthma Inhaler day of surgery                               You may not have any metal on your body including jewelry, and body piercings             Do not wear lotions, powders, perfumes/cologne, or deodorant                          Men may shave face and neck.   Do not bring valuables to the hospital. [place].   Contacts, dentures or bridgework may not be worn into surgery.    Patients discharged the day of surgery will not be allowed to drive home.   Special Instructions: Bring a copy of your healthcare power of attorney and living will documents         the day of surgery if you haven't scanned them in before.              Please read over the following fact sheets you were given: IF YOU HAVE QUESTIONS ABOUT YOUR PRE OP INSTRUCTIONS PLEASE CALL [phone]   [facility] - Preparing for Surgery Before surgery, you can play an important role.  Because skin is not sterile, your skin needs to be as free of germs as possible.  You can reduce the number of germs on your skin by washing with CHG (chlorahexidine gluconate) soap before surgery.  CHG is an antiseptic cleaner which kills germs and bonds with the skin to continue killing germs even after washing. Please DO NOT use if you have an allergy to CHG or antibacterial soaps.  If your skin becomes reddened/irritated stop using the CHG and inform your nurse when you arrive at Short Stay. Do not shave (including legs  and underarms) for at least 48 hours prior to the first CHG shower.  You may shave your face/neck.  Please follow these instructions carefully:  1.  Shower with CHG Soap the night before surgery and  the  morning of surgery.  2.  If you choose to wash your hair, wash your hair first as usual with your normal  shampoo.  3.  After you shampoo, rinse your hair and body thoroughly to remove the shampoo.                             4.  Use CHG as you would any other liquid soap.  You can apply chg directly to the skin and wash.  Gently with a scrungie or clean washcloth.  5.  Apply the CHG Soap to your body ONLY FROM THE NECK DOWN.   Do   not use on face/ open                           Wound or open sores. Avoid contact with eyes, ears mouth and   genitals (private parts).                       Wash face,  Genitals (private parts) with your normal soap.             6.  Wash thoroughly, paying special attention to the area where your    surgery  will be performed.  7.  Thoroughly rinse your body with warm water from the neck down.  8.  DO NOT shower/wash with your normal soap after using and rinsing off the CHG Soap.                9.  Pat yourself dry with a clean towel.            10.  Wear clean pajamas.            11.  Place clean sheets on your bed the night of your first shower and do not  sleep with pets. Day of Surgery : Do not apply any lotions/deodorants the morning of surgery.  Please wear clean clothes to the hospital/surgery center.  FAILURE TO FOLLOW THESE INSTRUCTIONS MAY RESULT IN THE CANCELLATION OF YOUR SURGERY  PATIENT SIGNATURE_________________________________  NURSE SIGNATURE__________________________________  ________________________________________________________________________

## 2019-12-30 NOTE — Progress Notes (Addendum)
COVID Vaccine Completed: N/A Date COVID Vaccine completed:N/A COVID vaccine manufacturer: N/A  PCP - N/A Cardiologist - N/A  Chest x-ray - N/A EKG - N/A Stress Test - N/A ECHO - N/A Cardiac Cath - N/A  Sleep Study - N/A CPAP - N/A  Fasting Blood Sugar - N/A Checks Blood Sugar __N/A___ times a day  Blood Thinner Instructions: N/A  Aspirin Instructions: N/A Last Dose: N/A  Anesthesia review: N/A  Patient denies shortness of breath, fever, cough and chest pain at PAT appointment   Patient verbalized understanding of instructions that were given to them at the PAT appointment. Patient was also instructed that they will need to review over the PAT instructions again at home before surgery.

## 2020-01-02 ENCOUNTER — Other Ambulatory Visit: Payer: Self-pay

## 2020-01-02 ENCOUNTER — Encounter (HOSPITAL_COMMUNITY): Payer: Self-pay

## 2020-01-02 ENCOUNTER — Encounter (HOSPITAL_COMMUNITY)
Admission: RE | Admit: 2020-01-02 | Discharge: 2020-01-02 | Disposition: A | Discharge status: Home / Self Care | Payer: Medicaid Other | Source: Ambulatory Visit | Attending: General Surgery | Admitting: General Surgery

## 2020-01-02 DIAGNOSIS — Z01812 Encounter for preprocedural laboratory examination: Secondary | ICD-10-CM | POA: Insufficient documentation

## 2020-01-02 HISTORY — DX: Cardiac murmur, unspecified: R01.1

## 2020-01-02 HISTORY — DX: Nausea with vomiting, unspecified: R11.2

## 2020-01-02 HISTORY — DX: Gastro-esophageal reflux disease without esophagitis: K21.9

## 2020-01-02 HISTORY — DX: Other specified postprocedural states: Z98.890

## 2020-01-02 LAB — CBC
HCT: 45.9 % (ref 39.0–52.0)
Hemoglobin: 14.4 g/dL (ref 13.0–17.0)
MCH: 26 pg (ref 26.0–34.0)
MCHC: 31.4 g/dL (ref 30.0–36.0)
MCV: 83 fL (ref 80.0–100.0)
Platelets: 244 10*3/uL (ref 150–400)
RBC: 5.53 MIL/uL (ref 4.22–5.81)
RDW: 12.4 % (ref 11.5–15.5)
WBC: 7.3 10*3/uL (ref 4.0–10.5)
nRBC: 0 % (ref 0.0–0.2)

## 2020-01-10 ENCOUNTER — Other Ambulatory Visit (HOSPITAL_COMMUNITY)
Admission: RE | Admit: 2020-01-10 | Discharge: 2020-01-10 | Disposition: A | Discharge status: Home / Self Care | Payer: Medicaid Other | Source: Ambulatory Visit | Attending: General Surgery | Admitting: General Surgery

## 2020-01-10 DIAGNOSIS — Z20822 Contact with and (suspected) exposure to covid-19: Secondary | ICD-10-CM | POA: Diagnosis not present

## 2020-01-10 DIAGNOSIS — Z01812 Encounter for preprocedural laboratory examination: Secondary | ICD-10-CM | POA: Diagnosis not present

## 2020-01-10 LAB — SARS CORONAVIRUS 2 (TAT 6-24 HRS): SARS Coronavirus 2: NEGATIVE

## 2020-01-12 ENCOUNTER — Other Ambulatory Visit: Payer: Self-pay

## 2020-01-12 ENCOUNTER — Ambulatory Visit (HOSPITAL_COMMUNITY): Payer: Self-pay | Admitting: Certified Registered Nurse Anesthetist

## 2020-01-12 ENCOUNTER — Ambulatory Visit (HOSPITAL_COMMUNITY)
Admission: RE | Admit: 2020-01-12 | Discharge: 2020-01-12 | Disposition: A | Discharge status: Home / Self Care | Payer: Self-pay | Source: Other Acute Inpatient Hospital | Attending: General Surgery | Admitting: General Surgery

## 2020-01-12 ENCOUNTER — Encounter (HOSPITAL_COMMUNITY)
Admission: RE | Disposition: A | Discharge status: Home / Self Care | Payer: Self-pay | Source: Other Acute Inpatient Hospital | Attending: General Surgery

## 2020-01-12 ENCOUNTER — Encounter (HOSPITAL_COMMUNITY): Payer: Self-pay | Admitting: General Surgery

## 2020-01-12 DIAGNOSIS — Z91030 Bee allergy status: Secondary | ICD-10-CM | POA: Insufficient documentation

## 2020-01-12 DIAGNOSIS — Z825 Family history of asthma and other chronic lower respiratory diseases: Secondary | ICD-10-CM | POA: Insufficient documentation

## 2020-01-12 DIAGNOSIS — Z809 Family history of malignant neoplasm, unspecified: Secondary | ICD-10-CM | POA: Insufficient documentation

## 2020-01-12 DIAGNOSIS — Z8261 Family history of arthritis: Secondary | ICD-10-CM | POA: Insufficient documentation

## 2020-01-12 DIAGNOSIS — Z91013 Allergy to seafood: Secondary | ICD-10-CM | POA: Insufficient documentation

## 2020-01-12 DIAGNOSIS — Z823 Family history of stroke: Secondary | ICD-10-CM | POA: Insufficient documentation

## 2020-01-12 DIAGNOSIS — R569 Unspecified convulsions: Secondary | ICD-10-CM | POA: Insufficient documentation

## 2020-01-12 DIAGNOSIS — K219 Gastro-esophageal reflux disease without esophagitis: Secondary | ICD-10-CM | POA: Insufficient documentation

## 2020-01-12 DIAGNOSIS — J45990 Exercise induced bronchospasm: Secondary | ICD-10-CM | POA: Insufficient documentation

## 2020-01-12 DIAGNOSIS — F17200 Nicotine dependence, unspecified, uncomplicated: Secondary | ICD-10-CM | POA: Insufficient documentation

## 2020-01-12 DIAGNOSIS — K4090 Unilateral inguinal hernia, without obstruction or gangrene, not specified as recurrent: Secondary | ICD-10-CM | POA: Insufficient documentation

## 2020-01-12 DIAGNOSIS — K429 Umbilical hernia without obstruction or gangrene: Secondary | ICD-10-CM | POA: Insufficient documentation

## 2020-01-12 DIAGNOSIS — Z91041 Radiographic dye allergy status: Secondary | ICD-10-CM | POA: Insufficient documentation

## 2020-01-12 DIAGNOSIS — Z833 Family history of diabetes mellitus: Secondary | ICD-10-CM | POA: Insufficient documentation

## 2020-01-12 DIAGNOSIS — R011 Cardiac murmur, unspecified: Secondary | ICD-10-CM | POA: Insufficient documentation

## 2020-01-12 DIAGNOSIS — Z8249 Family history of ischemic heart disease and other diseases of the circulatory system: Secondary | ICD-10-CM | POA: Insufficient documentation

## 2020-01-12 DIAGNOSIS — Z888 Allergy status to other drugs, medicaments and biological substances status: Secondary | ICD-10-CM | POA: Insufficient documentation

## 2020-01-12 HISTORY — PX: UMBILICAL HERNIA REPAIR: SHX196

## 2020-01-12 HISTORY — PX: INGUINAL HERNIA REPAIR: SHX194

## 2020-01-12 SURGERY — REPAIR, HERNIA, INGUINAL, ADULT
Anesthesia: General

## 2020-01-12 MED ORDER — MEPERIDINE HCL 50 MG/ML IJ SOLN: 6.2500 mg | INTRAMUSCULAR | Status: DC | PRN

## 2020-01-12 MED ORDER — ALBUTEROL SULFATE HFA 108 (90 BASE) MCG/ACT IN AERS
INHALATION_SPRAY | RESPIRATORY_TRACT | Status: AC
  Filled 2020-01-12: qty 6.7

## 2020-01-12 MED ORDER — FENTANYL CITRATE (PF) 100 MCG/2ML IJ SOLN
INTRAMUSCULAR | Status: AC
  Filled 2020-01-12: qty 2

## 2020-01-12 MED ORDER — CHLORHEXIDINE GLUCONATE CLOTH 2 % EX PADS: 6.0000 | MEDICATED_PAD | Freq: Once | CUTANEOUS | Status: DC

## 2020-01-12 MED ORDER — FENTANYL CITRATE (PF) 100 MCG/2ML IJ SOLN
INTRAMUSCULAR | Status: DC | PRN
  Administered 2020-01-12 (×7): 50 ug via INTRAVENOUS

## 2020-01-12 MED ORDER — BUPIVACAINE HCL 0.5 % IJ SOLN
INTRAMUSCULAR | Status: DC | PRN
  Administered 2020-01-12: 30 mL

## 2020-01-12 MED ORDER — BUPIVACAINE-EPINEPHRINE (PF) 0.25% -1:200000 IJ SOLN
INTRAMUSCULAR | Status: AC
  Filled 2020-01-12: qty 30

## 2020-01-12 MED ORDER — FENTANYL CITRATE (PF) 250 MCG/5ML IJ SOLN
INTRAMUSCULAR | Status: AC
  Filled 2020-01-12: qty 5

## 2020-01-12 MED ORDER — DEXAMETHASONE SODIUM PHOSPHATE 10 MG/ML IJ SOLN
INTRAMUSCULAR | Status: AC
  Filled 2020-01-12: qty 1

## 2020-01-12 MED ORDER — BUPIVACAINE HCL (PF) 0.5 % IJ SOLN
INTRAMUSCULAR | Status: AC
  Filled 2020-01-12: qty 30

## 2020-01-12 MED ORDER — ROCURONIUM BROMIDE 10 MG/ML (PF) SYRINGE
PREFILLED_SYRINGE | INTRAVENOUS | Status: DC | PRN
  Administered 2020-01-12: 100 mg via INTRAVENOUS

## 2020-01-12 MED ORDER — PROPOFOL 500 MG/50ML IV EMUL
INTRAVENOUS | Status: AC
  Filled 2020-01-12: qty 50

## 2020-01-12 MED ORDER — ONDANSETRON HCL 4 MG/2ML IJ SOLN
INTRAMUSCULAR | Status: AC
  Filled 2020-01-12: qty 2

## 2020-01-12 MED ORDER — IBUPROFEN 800 MG PO TABS
800.0000 mg | ORAL_TABLET | Freq: Three times a day (TID) | ORAL | 0 refills | Status: AC | PRN
Start: 2020-01-12 — End: ?

## 2020-01-12 MED ORDER — PROPOFOL 10 MG/ML IV BOLUS
INTRAVENOUS | Status: DC | PRN
  Administered 2020-01-12: 120 mg via INTRAVENOUS

## 2020-01-12 MED ORDER — CHLORHEXIDINE GLUCONATE 0.12 % MT SOLN
15.0000 mL | Freq: Once | OROMUCOSAL | Status: AC
  Administered 2020-01-12: 15 mL via OROMUCOSAL

## 2020-01-12 MED ORDER — LACTATED RINGERS IV SOLN: INTRAVENOUS | Status: DC

## 2020-01-12 MED ORDER — ROCURONIUM BROMIDE 10 MG/ML (PF) SYRINGE
PREFILLED_SYRINGE | INTRAVENOUS | Status: AC
  Filled 2020-01-12: qty 10

## 2020-01-12 MED ORDER — GABAPENTIN 300 MG PO CAPS
300.0000 mg | ORAL_CAPSULE | ORAL | Status: AC
  Administered 2020-01-12: 300 mg via ORAL
  Filled 2020-01-12: qty 1

## 2020-01-12 MED ORDER — DEXAMETHASONE SODIUM PHOSPHATE 4 MG/ML IJ SOLN
INTRAMUSCULAR | Status: DC | PRN
  Administered 2020-01-12: 10 mg via INTRAVENOUS

## 2020-01-12 MED ORDER — LIDOCAINE 2% (20 MG/ML) 5 ML SYRINGE
INTRAMUSCULAR | Status: AC
  Filled 2020-01-12: qty 5

## 2020-01-12 MED ORDER — BUPIVACAINE LIPOSOME 1.3 % IJ SUSP
20.0000 mL | Freq: Once | INTRAMUSCULAR | Status: AC
  Administered 2020-01-12: 20 mL
  Filled 2020-01-12: qty 20

## 2020-01-12 MED ORDER — CEFAZOLIN SODIUM-DEXTROSE 2-4 GM/100ML-% IV SOLN
2.0000 g | INTRAVENOUS | Status: AC
  Administered 2020-01-12: 2 g via INTRAVENOUS
  Filled 2020-01-12: qty 100

## 2020-01-12 MED ORDER — 0.9 % SODIUM CHLORIDE (POUR BTL) OPTIME
TOPICAL | Status: DC | PRN
  Administered 2020-01-12: 1000 mL

## 2020-01-12 MED ORDER — LIDOCAINE 2% (20 MG/ML) 5 ML SYRINGE
INTRAMUSCULAR | Status: DC | PRN
  Administered 2020-01-12: 100 mg via INTRAVENOUS

## 2020-01-12 MED ORDER — MIDAZOLAM HCL 2 MG/2ML IJ SOLN
INTRAMUSCULAR | Status: AC
  Filled 2020-01-12: qty 2

## 2020-01-12 MED ORDER — ACETAMINOPHEN 500 MG PO TABS
1000.0000 mg | ORAL_TABLET | ORAL | Status: AC
  Administered 2020-01-12: 1000 mg via ORAL
  Filled 2020-01-12: qty 2

## 2020-01-12 MED ORDER — ORAL CARE MOUTH RINSE: 15.0000 mL | Freq: Once | OROMUCOSAL | Status: AC

## 2020-01-12 MED ORDER — OXYCODONE HCL 5 MG PO TABS: 5.0000 mg | ORAL_TABLET | Freq: Four times a day (QID) | ORAL | 0 refills | Status: AC | PRN

## 2020-01-12 MED ORDER — HYDROMORPHONE HCL 1 MG/ML IJ SOLN: 0.2500 mg | INTRAMUSCULAR | Status: DC | PRN

## 2020-01-12 MED ORDER — ONDANSETRON HCL 4 MG/2ML IJ SOLN
INTRAMUSCULAR | Status: DC | PRN
  Administered 2020-01-12: 4 mg via INTRAVENOUS

## 2020-01-12 MED ORDER — ONDANSETRON HCL 4 MG/2ML IJ SOLN: 4.0000 mg | Freq: Once | INTRAMUSCULAR | Status: DC | PRN

## 2020-01-12 MED ORDER — KETOROLAC TROMETHAMINE 15 MG/ML IJ SOLN
15.0000 mg | INTRAMUSCULAR | Status: AC
  Administered 2020-01-12: 15 mg via INTRAVENOUS
  Filled 2020-01-12: qty 1

## 2020-01-12 MED ORDER — SUGAMMADEX SODIUM 200 MG/2ML IV SOLN
INTRAVENOUS | Status: DC | PRN
  Administered 2020-01-12: 200 mg via INTRAVENOUS

## 2020-01-12 MED ORDER — MIDAZOLAM HCL 5 MG/5ML IJ SOLN
INTRAMUSCULAR | Status: DC | PRN
  Administered 2020-01-12: 2 mg via INTRAVENOUS

## 2020-01-12 MED ORDER — ALBUTEROL SULFATE HFA 108 (90 BASE) MCG/ACT IN AERS
INHALATION_SPRAY | RESPIRATORY_TRACT | Status: DC | PRN
  Administered 2020-01-12: 5 via RESPIRATORY_TRACT

## 2020-01-12 SURGICAL SUPPLY — 51 items
BENZOIN TINCTURE PRP APPL 2/3 (GAUZE/BANDAGES/DRESSINGS) ×3 IMPLANT
BLADE SURG 15 STRL LF DISP TIS (BLADE) ×1 IMPLANT
BLADE SURG 15 STRL SS (BLADE) ×2
CELLS DAT CNTRL 66122 CELL SVR (MISCELLANEOUS) IMPLANT
CHLORAPREP W/TINT 26 (MISCELLANEOUS) ×3 IMPLANT
CLOSURE WOUND 1/2 X4 (GAUZE/BANDAGES/DRESSINGS)
COVER SURGICAL LIGHT HANDLE (MISCELLANEOUS) ×3 IMPLANT
COVER WAND RF STERILE (DRAPES) ×3 IMPLANT
DECANTER SPIKE VIAL GLASS SM (MISCELLANEOUS) ×3 IMPLANT
DERMABOND ADVANCED (GAUZE/BANDAGES/DRESSINGS) ×2
DERMABOND ADVANCED .7 DNX12 (GAUZE/BANDAGES/DRESSINGS) ×1 IMPLANT
DRAIN PENROSE 0.5X18 (DRAIN) IMPLANT
DRAPE LAPAROSCOPIC ABDOMINAL (DRAPES) ×3 IMPLANT
DRAPE LAPAROTOMY TRNSV 102X78 (DRAPES) ×3 IMPLANT
DRSG TEGADERM 4X4.75 (GAUZE/BANDAGES/DRESSINGS) IMPLANT
DRSG TELFA PLUS 4X6 ADH ISLAND (GAUZE/BANDAGES/DRESSINGS) ×3 IMPLANT
ELECT REM PT RETURN 15FT ADLT (MISCELLANEOUS) ×3 IMPLANT
GAUZE SPONGE 4X4 12PLY STRL (GAUZE/BANDAGES/DRESSINGS) IMPLANT
GLOVE BIOGEL PI IND STRL 7.0 (GLOVE) ×1 IMPLANT
GLOVE BIOGEL PI INDICATOR 7.0 (GLOVE) ×2
GLOVE SURG SS PI 7.0 STRL IVOR (GLOVE) ×3 IMPLANT
GOWN STRL REUS W/TWL LRG LVL3 (GOWN DISPOSABLE) ×3 IMPLANT
GOWN STRL REUS W/TWL XL LVL3 (GOWN DISPOSABLE) ×3 IMPLANT
KIT BASIN (CUSTOM PROCEDURE TRAY) ×3 IMPLANT
KIT TURNOVER KIT A (KITS) IMPLANT
MESH HERNIA 3X6 (Mesh General) ×3 IMPLANT
NEEDLE HYPO 22GX1.5 SAFETY (NEEDLE) ×3 IMPLANT
PACK BASIC VI WITH GOWN DISP (CUSTOM PROCEDURE TRAY) ×3 IMPLANT
PENCIL SMOKE EVACUATOR (MISCELLANEOUS) IMPLANT
RETRACTOR WND ALEXIS 25 LRG (MISCELLANEOUS) IMPLANT
RTRCTR WOUND ALEXIS 18CM MED (MISCELLANEOUS)
RTRCTR WOUND ALEXIS 25CM LRG (MISCELLANEOUS)
SPONGE LAP 18X18 RF (DISPOSABLE) IMPLANT
SPONGE LAP 4X18 RFD (DISPOSABLE) ×3 IMPLANT
STRIP CLOSURE SKIN 1/2X4 (GAUZE/BANDAGES/DRESSINGS) IMPLANT
SUT MNCRL AB 4-0 PS2 18 (SUTURE) ×3 IMPLANT
SUT NOVA NAB GS-21 0 18 T12 DT (SUTURE) IMPLANT
SUT PDS AB 0 CT 36 (SUTURE) ×6 IMPLANT
SUT PROLENE 2 0 CT2 30 (SUTURE) ×6 IMPLANT
SUT SILK 2 0 SH CR/8 (SUTURE) IMPLANT
SUT VIC AB 2-0 CT1 27 (SUTURE) ×2
SUT VIC AB 2-0 CT1 TAPERPNT 27 (SUTURE) ×1 IMPLANT
SUT VIC AB 2-0 SH 18 (SUTURE) ×3 IMPLANT
SUT VIC AB 3-0 SH 27 (SUTURE) ×6
SUT VIC AB 3-0 SH 27X BRD (SUTURE) ×1 IMPLANT
SUT VIC AB 3-0 SH 27XBRD (SUTURE) ×2 IMPLANT
SYR BULB IRRIG 60ML STRL (SYRINGE) ×3 IMPLANT
SYR CONTROL 10ML LL (SYRINGE) ×3 IMPLANT
TOWEL OR 17X26 10 PK STRL BLUE (TOWEL DISPOSABLE) ×3 IMPLANT
TOWEL OR NON WOVEN STRL DISP B (DISPOSABLE) ×3 IMPLANT
YANKAUER SUCT BULB TIP 10FT TU (MISCELLANEOUS) ×3 IMPLANT

## 2020-01-12 NOTE — Transfer of Care (Signed)
Immediate Anesthesia Transfer of Care Note  Patient: Anthony Yu  Procedure(s) Performed: RIGHT OPEN INGUINAL HERNIA REPAIR WITH MESH (N/A ) OPEN UMBILICAL HERNIA (N/A )  Patient Location: PACU  Anesthesia Type:General  Level of Consciousness: awake and patient cooperative  Airway & Oxygen Therapy: Patient Spontanous Breathing and Patient connected to face mask  Post-op Assessment: Report given to RN and Post -op Vital signs reviewed and stable  Post vital signs: Reviewed and stable  Last Vitals:  Vitals Value Taken Time  BP 124/81 01/12/20 1000  Temp    Pulse 78 01/12/20 1001  Resp 11 01/12/20 1001  SpO2 95 % 01/12/20 1001  Vitals shown include unvalidated device data.  Last Pain:  Vitals:   01/12/20 0556  TempSrc: Oral  PainSc:       Patients Stated Pain Goal: 4 (01/12/20 0547)  Complications: No complications documented.

## 2020-01-12 NOTE — Anesthesia Procedure Notes (Signed)
Procedure Name: Intubation Date/Time: 01/12/2020 7:45 AM Performed by: Vanessa Elrama, CRNA Pre-anesthesia Checklist: Patient identified, Emergency Drugs available, Suction available and Patient being monitored Patient Re-evaluated:Patient Re-evaluated prior to induction Oxygen Delivery Method: Circle system utilized Preoxygenation: Pre-oxygenation with 100% oxygen Induction Type: IV induction Ventilation: Mask ventilation without difficulty Laryngoscope Size: 2 and Miller Grade View: Grade I Tube type: Oral Tube size: 7.5 mm Number of attempts: 1 Airway Equipment and Method: Stylet Placement Confirmation: ETT inserted through vocal cords under direct vision,  positive ETCO2 and breath sounds checked- equal and bilateral Secured at: 21 cm Tube secured with: Tape Dental Injury: Teeth and Oropharynx as per pre-operative assessment

## 2020-01-12 NOTE — Anesthesia Preprocedure Evaluation (Signed)
Anesthesia Evaluation  Patient identified by MRN, date of birth, ID band Patient awake    Reviewed: Allergy & Precautions, NPO status , Patient's Chart, lab work & pertinent test results  History of Anesthesia Complications (+) PONV  Airway Mallampati: I  TM Distance: >3 FB Neck ROM: Full    Dental   Pulmonary    Pulmonary exam normal        Cardiovascular Normal cardiovascular exam     Neuro/Psych Seizures -,     GI/Hepatic GERD  Medicated and Controlled,  Endo/Other    Renal/GU      Musculoskeletal   Abdominal   Peds  Hematology   Anesthesia Other Findings   Reproductive/Obstetrics                             Anesthesia Physical Anesthesia Plan  ASA: II  Anesthesia Plan: General   Post-op Pain Management:    Induction: Intravenous  PONV Risk Score and Plan: 3 and Ondansetron, Dexamethasone and Midazolam  Airway Management Planned: Oral ETT  Additional Equipment:   Intra-op Plan:   Post-operative Plan: Extubation in OR  Informed Consent: I have reviewed the patients History and Physical, chart, labs and discussed the procedure including the risks, benefits and alternatives for the proposed anesthesia with the patient or authorized representative who has indicated his/her understanding and acceptance.       Plan Discussed with: CRNA and Surgeon  Anesthesia Plan Comments:         Anesthesia Quick Evaluation

## 2020-01-12 NOTE — Anesthesia Postprocedure Evaluation (Signed)
Anesthesia Post Note  Patient: Anthony Yu  Procedure(s) Performed: RIGHT OPEN INGUINAL HERNIA REPAIR WITH MESH (N/A ) OPEN UMBILICAL HERNIA (N/A )     Patient location during evaluation: PACU Anesthesia Type: General Level of consciousness: awake and alert Pain management: pain level controlled Vital Signs Assessment: post-procedure vital signs reviewed and stable Respiratory status: spontaneous breathing, nonlabored ventilation, respiratory function stable and patient connected to nasal cannula oxygen Cardiovascular status: blood pressure returned to baseline and stable Postop Assessment: no apparent nausea or vomiting Anesthetic complications: no   No complications documented.  Last Vitals:  Vitals:   01/12/20 1215 01/12/20 1241  BP: 132/83 124/73  Pulse: 82 88  Resp: 12 10  Temp: (!) 36.3 C   SpO2: 97% 97%    Last Pain:  Vitals:   01/12/20 1241  TempSrc:   PainSc: 0-No pain                 Nalu Troublefield DAVID

## 2020-01-12 NOTE — Op Note (Signed)
Preop diagnosis: right open inguinal hernia, umbilical hernia  Postop diagnosis: right open indirect inguinal hernia, umbilical hernia  Procedure: open Right inguinal hernia repair with mesh, open umbilical hernia repair  Surgeon: Feliciana Rossetti, M.D.  Asst: none  Anesthesia: Gen.   Indications for procedure: Anthony Yu is a 24 y.o. male with symptoms of pain and enlarging Right inguinal hernia(s). After discussing risks, alternatives and benefits he decided on open repair and was brought to day surgery for repair.  Description of procedure: The patient was brought into the operative suite, placed supine. Anesthesia was administered with endotracheal tube. Patient was strapped in place. The patient was prepped and draped in the usual sterile fashion.  The anterior superior iliac spine and pubic tubercle were identified on the Right side. An incision was made 1cm above the connecting line, representative of the location of the inguinal ligament. The subcutaneous tissue was bluntly dissected, scarpa's fascia was dissected away. The external abdominal oblique fascia was identified and sharply opened down to the external inguinal ring. The conjoint tendon and inguinal ligament were identified. The cord structures and sac were dissected free of the surrounding tissue in 360 degrees. A penrose drain was used to encircle the contents. The cremasteric fibers were dissected free of the contents of the cord and hernia sac. The cord structures (vessels and vas deferens) were identified and carefully dissected away from the hernia sac. The hernia sac was opened and a large amount of omentum was pulled out of the sac and reduced into the abdomen. The hernia sac was dissected down to the internal inguinal ring. Preperitoneal fat was identified showing appropriate dissection. The sac was divided at the base and sutured closed with a running 3-0 vicryl The sac was then reduced into the preperitoneal space. The  floor was recreated by suturing the conjoint ligament to inguinal ligament with interrupted 2-0 PDS. A 3x6 Bard mesh was then used to close the defect and reinforce the floor. The mesh was sutured to the lacunar ligament and inguinal ligament using a 2-0 prolene in running fashion. Next the superior edge of the mesh was sutured to the conjoined tendon using a 2-0 running Prolene. An additional 2-0 Prolene was used to suture the tail ends of the mesh together re-creating the deep ring. Cord structures are running in a neutral position through the mesh. Next the external abdominal oblique fascia was closed with a 2-0 Vicryl in running fashion to re-create the external inguinal ring. Scarpa's fascia was closed with 3-0 Vicryl in running fashion. Skin was closed with a 4-0 Monocryl subcuticular stitch in running fashion.   Next, attention was turned to the umbilical hernia. An infraumbilical incision was made. Blunt dissection was used to free the umbilical hernia from the surrounding tissue. The skin of the umbilicus was removed from the sac. The sac was entered and omentum was seen. A portion of the omentum was removed with cautery and the remainder reduced. The fascial defect was around 2 cm in size. The defect was closed with 0 PDS in interrupted fashion. The skin was sutured to the fascia with 2-0 vicryl and the deep dermal layer was apposed with 2-0 vicryl. The skin was closed with running 4-0 monocryl. Dermabond place for dressing. Patient woke from anesthesia and brought to PACU in stable condition. All counts are correct.  Findings: right inguinal hernia, umbilical hernia  Specimen: none  Blood loss: 20 ml  Local anesthesia: 50 ml Exparel: Marcaine mix  Complications: none  Implant: 3  x 6 in Bard Mesh  Feliciana Rossetti, M.D. General, Bariatric, & Minimally Invasive Surgery [facility], [place] 9:48 AM 01/12/2020

## 2020-01-12 NOTE — H&P (Signed)
Anthony Yu is an 24 y.o. male.  HPI: 24 yo male with symptomatic right inguinal hernia. He has pain often. He denies nausea or vomiting. The hernia has been getting bigger. He continues to use tobacco products. He also has a small umbilical hernia.  Past Medical History:  Diagnosis Date   Acute maxillary sinusitis    Allergic rhinitis, cause unspecified    Asthma    Exercise induced bronchospasm    GERD (gastroesophageal reflux disease)    Heart murmur    Born with heart murmur   Need for prophylactic vaccination and inoculation against varicella    PONV (postoperative nausea and vomiting)    Seizures (HCC)    Syncope and collapse     Past Surgical History:  Procedure Laterality Date   NASAL SEPTUM SURGERY  June 2016   Deviated Septum    Family History  Problem Relation Age of Onset   Anemia Mother    Asthma Mother    Arthritis Mother    Hypercholesterolemia Mother    Heart disease Father    Hypertension Other    Diabetes Other    Cancer Other    Asthma Sister    Breast cancer Maternal Grandmother    Stroke Other    Heart disease Brother    Heart disease Sister    Heart attack Other     Social History:  reports that he has never smoked. He has never used smokeless tobacco. He reports that he does not drink alcohol and does not use drugs.  Allergies:  Allergies  Allergen Reactions   Bee Venom Anaphylaxis   Shellfish-Derived Products Anaphylaxis   Diphenhydramine Nausea And Vomiting and Rash   Ivp Dye [Iodinated Diagnostic Agents] Rash    Medications: I have reviewed the patient's current medications.  Results for orders placed or performed during the hospital encounter of 01/10/20 (from the past 48 hour(s))  SARS CORONAVIRUS 2 (TAT 6-24 HRS) Nasopharyngeal Nasopharyngeal Swab     Status: None   Collection Time: 01/10/20  8:32 AM   Specimen: Nasopharyngeal Swab  Result Value Ref Range   SARS Coronavirus 2 NEGATIVE NEGATIVE     Comment: (NOTE) SARS-CoV-2 target nucleic acids are NOT DETECTED.  The SARS-CoV-2 RNA is generally detectable in upper and lower respiratory specimens during the acute phase of infection. Negative results do not preclude SARS-CoV-2 infection, do not rule out co-infections with other pathogens, and should not be used as the sole basis for treatment or other patient management decisions. Negative results must be combined with clinical observations, patient history, and epidemiological information. The expected result is Negative.  Fact Sheet for Patients: HairSlick.no  Fact Sheet for Healthcare Providers: quierodirigir.com  This test is not yet approved or cleared by the [place] FDA and  has been authorized for detection and/or diagnosis of SARS-CoV-2 by FDA under an Emergency Use Authorization (EUA). This EUA will remain  in effect (meaning this test can be used) for the duration of the COVID-19 declaration under Se ction 564(b)(1) of the Act, 21 U.S.C. section 360bbb-3(b)(1), unless the authorization is terminated or revoked sooner.  Performed at [facility], [address] [place], [place] [address]     No results found.  Review of Systems  Constitutional: Negative for chills and fever.  HENT: Negative for hearing loss.   Eyes: Negative for blurred vision and double vision.  Respiratory: Negative for cough and hemoptysis.   Cardiovascular: Negative for chest pain and palpitations.  Gastrointestinal:  Negative for abdominal pain, nausea and vomiting.  Genitourinary: Negative for dysuria and urgency.  Musculoskeletal: Negative for myalgias and neck pain.  Skin: Negative for itching and rash.  Neurological: Negative for dizziness, tingling and headaches.  Endo/Heme/Allergies: Does not bruise/bleed easily.  Psychiatric/Behavioral: Negative for depression and suicidal ideas.    PE Blood pressure (!)  155/85, pulse 83, temperature 97.9 F (36.6 C), temperature source Oral, resp. rate 16, height 6\' 3"  (1.905 m), weight 98.4 kg, SpO2 97 %. Constitutional: NAD; conversant; no deformities Eyes: Moist conjunctiva; no lid lag; anicteric; PERRL Neck: Trachea midline; no thyromegaly Lungs: Normal respiratory effort; no tactile fremitus CV: RRR; no palpable thrills; no pitting edema GI: Abd large right inguinal hernia, umbilical hernia; no palpable hepatosplenomegaly MSK: Normal gait; no clubbing/cyanosis Psychiatric: Appropriate affect; alert and oriented x3 Lymphatic: No palpable cervical or axillary lymphadenopathy   Assessment/Plan: 24 yo male right inguinal hernia and umbilical hernia -right open inguinal hernia and umbilical hernia repair -outpatient procedure  22 Jayshon Dommer 01/12/2020, 7:26 AM

## 2020-01-13 ENCOUNTER — Encounter (HOSPITAL_COMMUNITY): Payer: Self-pay | Admitting: General Surgery

## 2020-12-07 IMAGING — CT CT ABD-PELV W/ CM
2 of 5 series · 15 of 46 positions shown, 17 images · IV contrast (APPLIED)
Comparison: None.

CLINICAL DATA: Abdominal pain, inguinal pain, suspected hernia

EXAM:
CT ABDOMEN AND PELVIS WITH CONTRAST
TECHNIQUE: Multidetector CT imaging of the abdomen and pelvis was performed
using the standard protocol following bolus administration of
intravenous contrast.
CONTRAST:  100mL OMNIPAQUE IOHEXOL 300 MG/ML  SOLN

[Series 4: abdomen 5.0 · axial · 0.92mm/px · z∈[+545,+1110]mm · 12 of 129 slices shown, 14 images]
[im 8/129  soft-tissue]
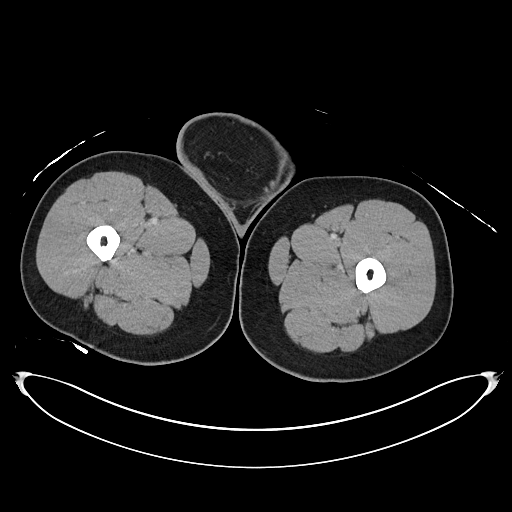
[im 8/129  bone]
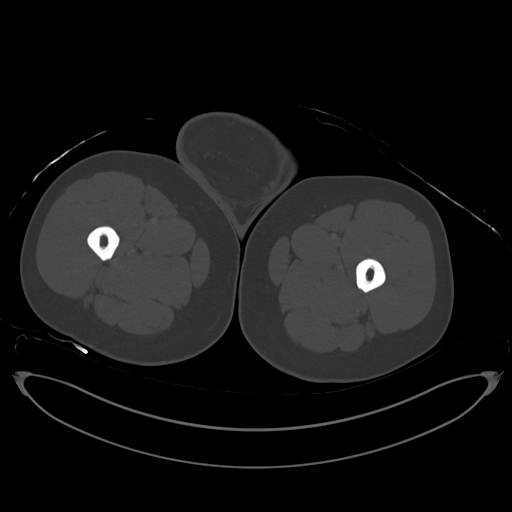
[im 23/129  soft-tissue]
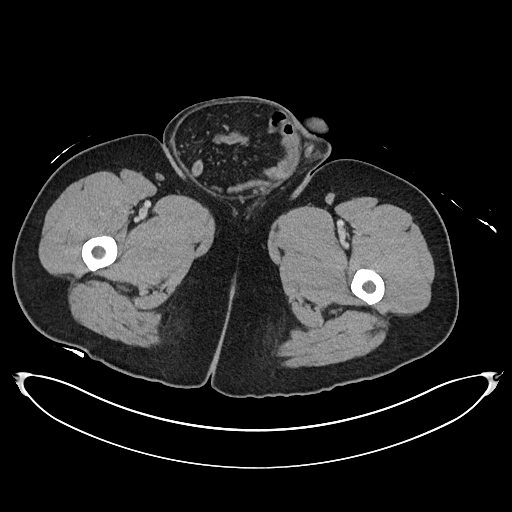
[im 31/129  soft-tissue]
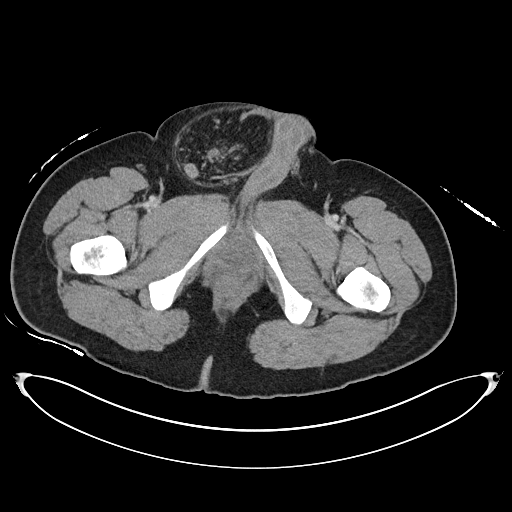
[im 38/129  soft-tissue]
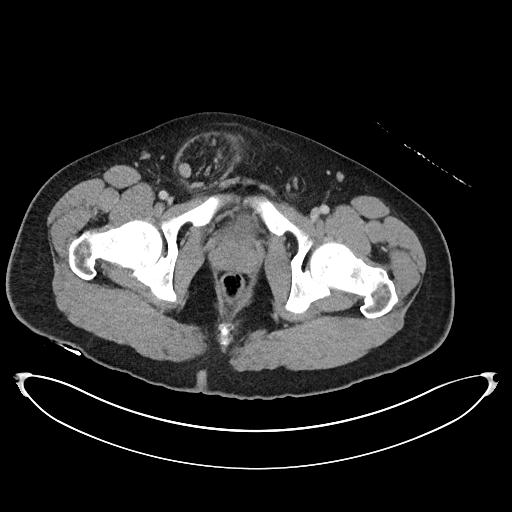
[im 53/129  soft-tissue]
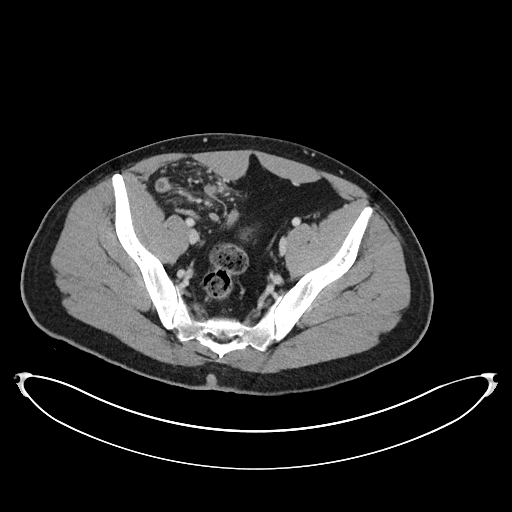
[im 61/129  soft-tissue]
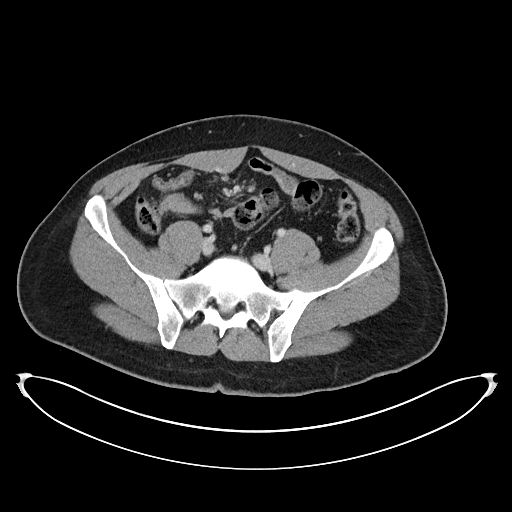
[im 68/129  soft-tissue]
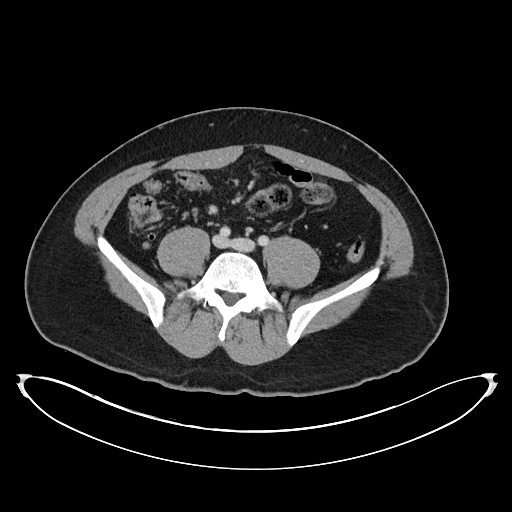
[im 83/129  soft-tissue]
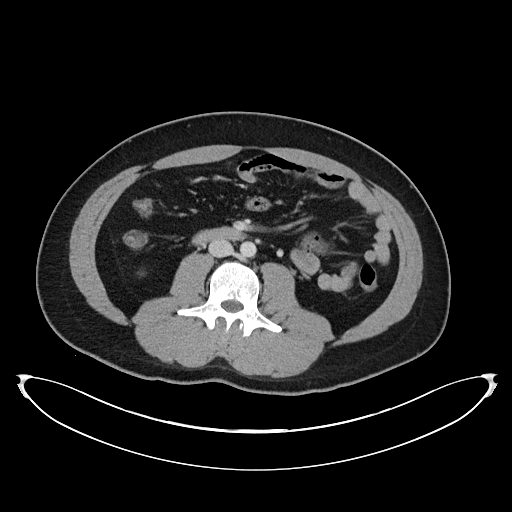
[im 91/129  soft-tissue]
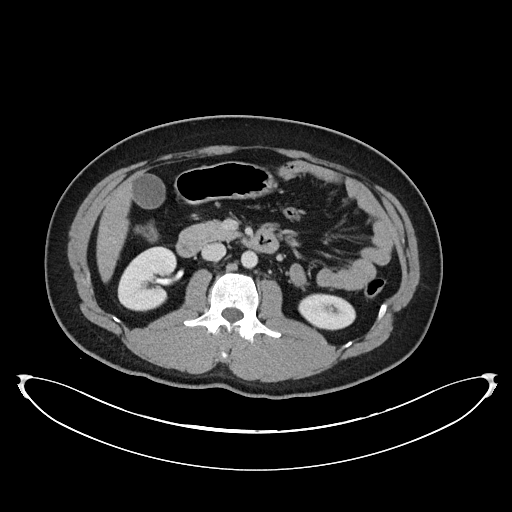
[im 91/129  bone]
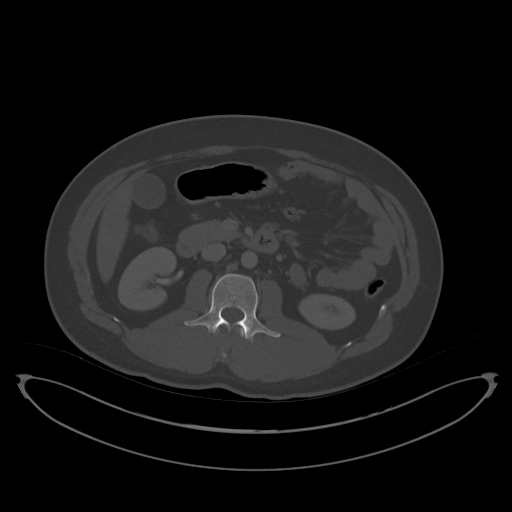
[im 98/129  soft-tissue]
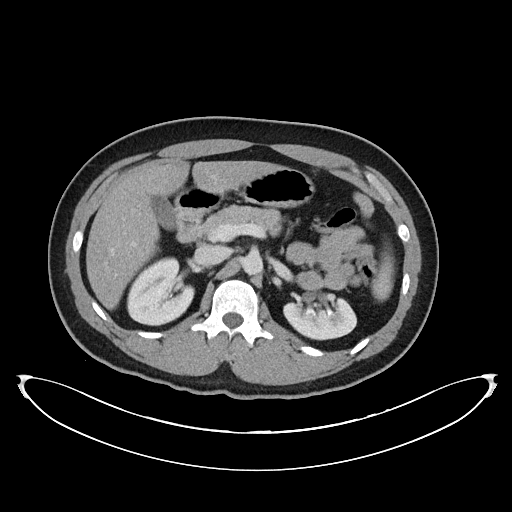
[im 113/129  soft-tissue]
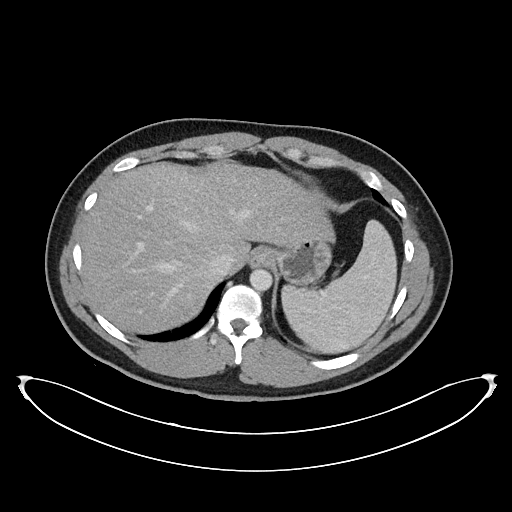
[im 121/129  soft-tissue]
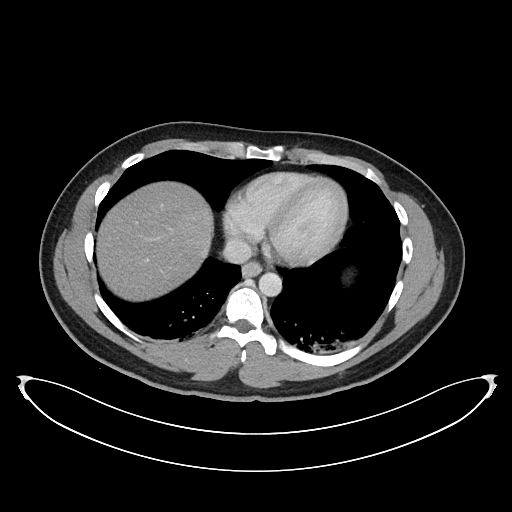

[Series 7: abdomen 3.0 mpr cor · coronal · 0.83mm/px · 3 of 94 slices shown]
[im 32/94  soft-tissue]
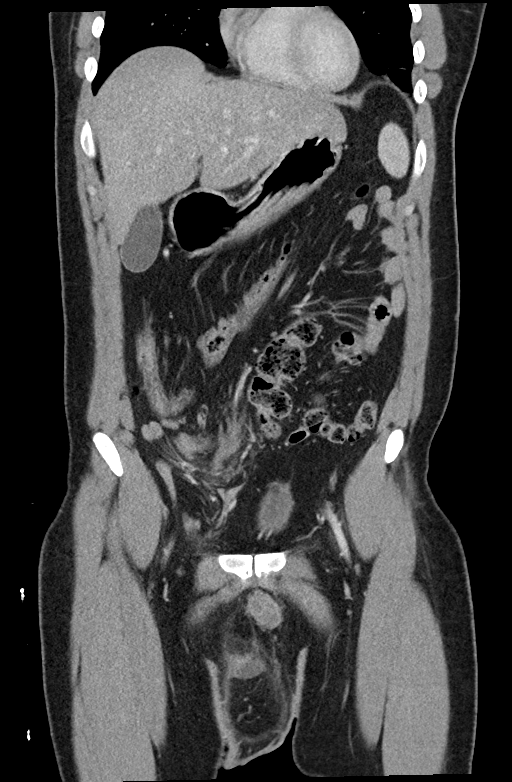
[im 42/94  soft-tissue]
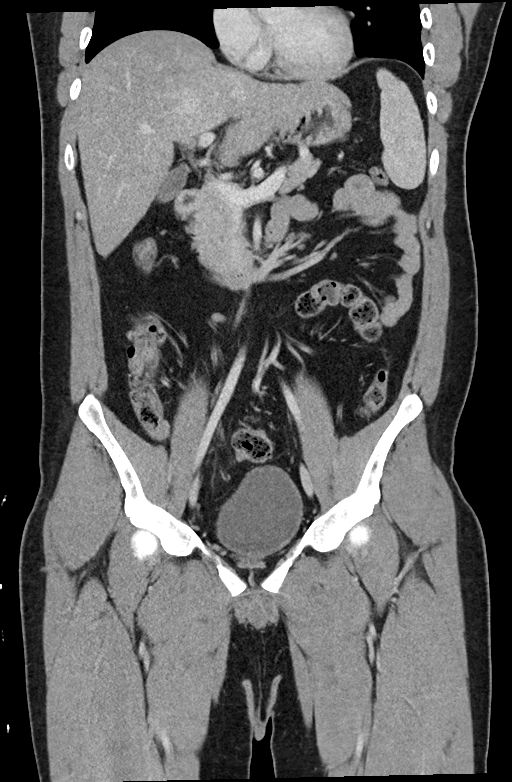
[im 52/94  soft-tissue]
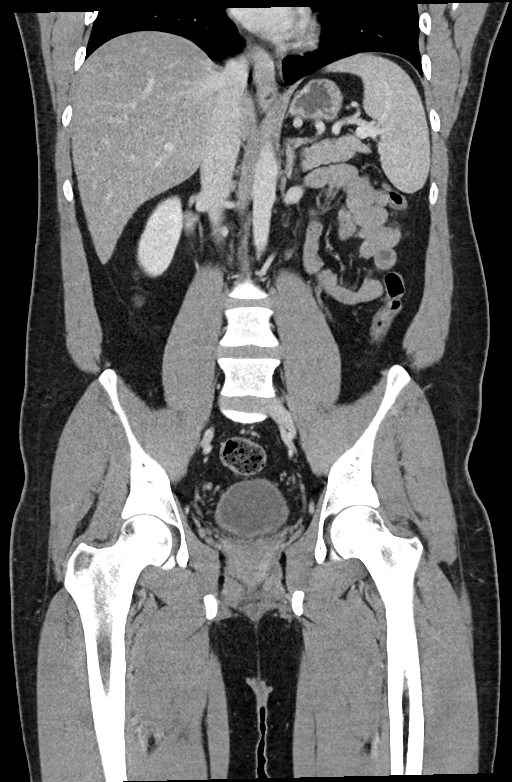

[15 of 46 positions shown; findings below may reference images not displayed]

FINDINGS: Lower chest: Hypoventilatory changes are seen the lung bases. No
acute pleural or parenchymal lung disease.

Hepatobiliary: No focal liver abnormality is seen. No gallstones,
gallbladder wall thickening, or biliary dilatation.

Pancreas: Unremarkable. No pancreatic ductal dilatation or
surrounding inflammatory changes.

Spleen: Normal in size without focal abnormality.

Adrenals/Urinary Tract: Adrenal glands are unremarkable. Kidneys are
normal, without renal calculi, focal lesion, or hydronephrosis.
Bladder is unremarkable.

Stomach/Bowel: Normal appendix right lower quadrant. No bowel
obstruction or ileus. No bowel wall thickening or inflammatory
changes.

Vascular/Lymphatic: No significant vascular findings are present. No
enlarged abdominal or pelvic lymph nodes.

Reproductive: Prostate is unremarkable.

Other: There is a large right inguinal hernia, extending into the
right hemiscrotum. Hernia sac measures 5.4 cm at the level of the
inguinal canal. The hernia contains multiple loops of distal small
bowel, though I do not see any bowel wall thickening. Minimal fat
stranding within the herniated fat. No free fluid.

There is a fat containing supraumbilical hernia.

Musculoskeletal: No acute or destructive bony lesions. Reconstructed
images demonstrate no additional findings.
IMPRESSION: 1. Large right inguinal hernia, extending into the right
hemiscrotum, containing mesenteric fat and multiple loops of distal
small bowel. No evidence of bowel wall edema or incarceration.
2. Fat containing umbilical hernia.
